# Patient Record
Sex: Female | Born: 1955 | Race: White | Marital: Married
Health system: Northeastern US, Academic
[De-identification: ages and names within clinical notes are randomized; demographics above are authoritative.]

## PROBLEM LIST (undated history)

## (undated) DIAGNOSIS — Z85820 Personal history of malignant melanoma of skin: Secondary | ICD-10-CM

## (undated) DIAGNOSIS — M8580 Other specified disorders of bone density and structure, unspecified site: Secondary | ICD-10-CM

## (undated) DIAGNOSIS — S5292XA Unspecified fracture of left forearm, initial encounter for closed fracture: Secondary | ICD-10-CM

## (undated) DIAGNOSIS — R51 Headache: Secondary | ICD-10-CM

## (undated) DIAGNOSIS — E559 Vitamin D deficiency, unspecified: Secondary | ICD-10-CM

## (undated) DIAGNOSIS — N841 Polyp of cervix uteri: Secondary | ICD-10-CM

## (undated) DIAGNOSIS — R519 Headache, unspecified: Secondary | ICD-10-CM

## (undated) DIAGNOSIS — I499 Cardiac arrhythmia, unspecified: Secondary | ICD-10-CM

## (undated) DIAGNOSIS — R002 Palpitations: Secondary | ICD-10-CM

## (undated) DIAGNOSIS — D649 Anemia, unspecified: Secondary | ICD-10-CM

## (undated) DIAGNOSIS — Z862 Personal history of diseases of the blood and blood-forming organs and certain disorders involving the immune mechanism: Secondary | ICD-10-CM

## (undated) DIAGNOSIS — Z9889 Other specified postprocedural states: Secondary | ICD-10-CM

## (undated) HISTORY — DX: Unspecified fracture of left forearm, initial encounter for closed fracture: S52.92XA

## (undated) HISTORY — DX: Vitamin D deficiency, unspecified: E55.9

## (undated) HISTORY — PX: WISDOM TOOTH EXTRACTION: SHX21

## (undated) HISTORY — DX: Anemia, unspecified: D64.9

## (undated) HISTORY — DX: Personal history of diseases of the blood and blood-forming organs and certain disorders involving the immune mechanism: Z86.2

## (undated) HISTORY — DX: Palpitations: R00.2

## (undated) HISTORY — DX: Other specified disorders of bone density and structure, unspecified site: M85.80

## (undated) HISTORY — DX: Other specified postprocedural states: Z98.890

## (undated) HISTORY — DX: Polyp of cervix uteri: N84.1

## (undated) HISTORY — PX: COLONOSCOPY: SHX174

## (undated) HISTORY — DX: Personal history of malignant melanoma of skin: Z85.820

---
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
## 1982-12-04 HISTORY — PX: BREAST LUMPECTOMY: SHX2

## 1998-12-04 DIAGNOSIS — Z9889 Other specified postprocedural states: Secondary | ICD-10-CM

## 1998-12-04 HISTORY — DX: Other specified postprocedural states: Z98.890

## 2004-02-08 ENCOUNTER — Other Ambulatory Visit: Admission: RE | Admit: 2004-02-08 | Discharge: 2004-02-08 | Payer: Self-pay | Admitting: *Deleted

## 2004-11-22 ENCOUNTER — Ambulatory Visit: Payer: Self-pay | Admitting: Internal Medicine

## 2005-04-18 ENCOUNTER — Other Ambulatory Visit: Admission: RE | Admit: 2005-04-18 | Discharge: 2005-04-18 | Payer: Self-pay | Admitting: *Deleted

## 2005-05-02 ENCOUNTER — Ambulatory Visit: Payer: Self-pay | Admitting: Internal Medicine

## 2005-12-04 DIAGNOSIS — N841 Polyp of cervix uteri: Secondary | ICD-10-CM

## 2005-12-04 HISTORY — DX: Polyp of cervix uteri: N84.1

## 2006-01-16 ENCOUNTER — Ambulatory Visit: Payer: Self-pay | Admitting: Internal Medicine

## 2006-01-19 ENCOUNTER — Ambulatory Visit: Payer: Self-pay | Admitting: Internal Medicine

## 2006-02-06 ENCOUNTER — Ambulatory Visit: Payer: Self-pay | Admitting: Internal Medicine

## 2006-03-14 ENCOUNTER — Encounter: Payer: Self-pay | Admitting: Internal Medicine

## 2006-06-27 ENCOUNTER — Other Ambulatory Visit: Admission: RE | Admit: 2006-06-27 | Discharge: 2006-06-27 | Payer: Self-pay | Admitting: Obstetrics & Gynecology

## 2007-02-20 ENCOUNTER — Ambulatory Visit: Payer: Self-pay | Admitting: Internal Medicine

## 2007-10-11 ENCOUNTER — Other Ambulatory Visit: Admission: RE | Admit: 2007-10-11 | Discharge: 2007-10-11 | Payer: Self-pay | Admitting: Obstetrics & Gynecology

## 2007-12-06 ENCOUNTER — Emergency Department (HOSPITAL_COMMUNITY): Admission: EM | Admit: 2007-12-06 | Discharge: 2007-12-06 | Payer: Self-pay | Admitting: Emergency Medicine

## 2008-03-18 ENCOUNTER — Encounter: Payer: Self-pay | Admitting: Internal Medicine

## 2008-07-10 ENCOUNTER — Ambulatory Visit: Payer: Self-pay | Admitting: Internal Medicine

## 2008-07-10 DIAGNOSIS — R5383 Other fatigue: Secondary | ICD-10-CM | POA: Insufficient documentation

## 2008-07-10 DIAGNOSIS — M2550 Pain in unspecified joint: Secondary | ICD-10-CM

## 2008-07-10 DIAGNOSIS — IMO0001 Reserved for inherently not codable concepts without codable children: Secondary | ICD-10-CM | POA: Insufficient documentation

## 2008-07-10 DIAGNOSIS — M2559 Pain in other specified joint: Secondary | ICD-10-CM | POA: Insufficient documentation

## 2008-07-10 DIAGNOSIS — R5381 Other malaise: Secondary | ICD-10-CM | POA: Insufficient documentation

## 2008-07-22 LAB — CONVERTED CEMR LAB
Albumin: 4.7 g/dL (ref 3.5–5.2)
CO2: 26 meq/L (ref 19–32)
Calcium: 9.9 mg/dL (ref 8.4–10.5)
Creatinine, Ser: 0.86 mg/dL (ref 0.40–1.20)
Eosinophils Absolute: 0.2 10*3/uL (ref 0.0–0.7)
Eosinophils Relative: 4 % (ref 0–5)
Free T4: 1.2 ng/dL (ref 0.89–1.80)
Glucose, Bld: 87 mg/dL (ref 70–99)
HCT: 39.8 % (ref 36.0–46.0)
Indirect Bilirubin: 0.8 mg/dL (ref 0.0–0.9)
Lymphs Abs: 1.4 10*3/uL (ref 0.7–4.0)
MCHC: 34.2 g/dL (ref 30.0–36.0)
MCV: 87.9 fL (ref 78.0–100.0)
Platelets: 245 10*3/uL (ref 150–400)
RDW: 13 % (ref 11.5–15.5)
Rhuematoid fact SerPl-aCnc: <20 intl units/mL (ref 0–20)
Sed Rate: 5 mm/hr (ref 0–22)
Total Protein: 7.2 g/dL (ref 6.0–8.3)
WBC: 4.7 10*3/uL (ref 4.0–10.5)

## 2008-08-17 ENCOUNTER — Ambulatory Visit: Payer: Self-pay | Admitting: Sports Medicine

## 2008-08-17 DIAGNOSIS — M25569 Pain in unspecified knee: Secondary | ICD-10-CM | POA: Insufficient documentation

## 2008-09-07 ENCOUNTER — Ambulatory Visit: Payer: Self-pay | Admitting: Sports Medicine

## 2008-09-24 ENCOUNTER — Telehealth: Payer: Self-pay | Admitting: Internal Medicine

## 2008-10-07 ENCOUNTER — Ambulatory Visit: Payer: Self-pay | Admitting: Sports Medicine

## 2008-10-07 ENCOUNTER — Encounter: Payer: Self-pay | Admitting: Internal Medicine

## 2008-10-09 ENCOUNTER — Encounter: Payer: Self-pay | Admitting: Sports Medicine

## 2008-11-09 ENCOUNTER — Ambulatory Visit: Payer: Self-pay | Admitting: Internal Medicine

## 2008-12-02 ENCOUNTER — Encounter: Payer: Self-pay | Admitting: Internal Medicine

## 2008-12-02 LAB — CONVERTED CEMR LAB
LDL Cholesterol: 110 mg/dL — ABNORMAL HIGH (ref 0–99)
Total CHOL/HDL Ratio: 2.7
VLDL: 10 mg/dL (ref 0–40)

## 2008-12-16 ENCOUNTER — Other Ambulatory Visit: Admission: RE | Admit: 2008-12-16 | Discharge: 2008-12-16 | Payer: Self-pay | Admitting: Obstetrics & Gynecology

## 2009-03-29 ENCOUNTER — Telehealth (INDEPENDENT_AMBULATORY_CARE_PROVIDER_SITE_OTHER): Payer: Self-pay | Admitting: *Deleted

## 2009-07-28 ENCOUNTER — Encounter: Payer: Self-pay | Admitting: Internal Medicine

## 2010-05-30 ENCOUNTER — Ambulatory Visit: Payer: Self-pay | Admitting: Internal Medicine

## 2010-05-30 LAB — CONVERTED CEMR LAB
BUN: 18 mg/dL (ref 6–23)
Basophils Relative: 0.8 % (ref 0.0–3.0)
Bilirubin Urine: NEGATIVE
Bilirubin, Direct: 0.2 mg/dL (ref 0.0–0.3)
CO2: 30 meq/L (ref 19–32)
Chloride: 107 meq/L (ref 96–112)
Cholesterol: 201 mg/dL — ABNORMAL HIGH (ref 0–200)
Creatinine, Ser: 0.7 mg/dL (ref 0.4–1.2)
Direct LDL: 104 mg/dL
Eosinophils Absolute: 0.2 10*3/uL (ref 0.0–0.7)
Glucose, Urine, Semiquant: NEGATIVE
Lymphs Abs: 1.4 10*3/uL (ref 0.7–4.0)
MCHC: 34.9 g/dL (ref 30.0–36.0)
MCV: 90.9 fL (ref 78.0–100.0)
Monocytes Absolute: 0.3 10*3/uL (ref 0.1–1.0)
Neutrophils Relative %: 55.2 % (ref 43.0–77.0)
Platelets: 199 10*3/uL (ref 150.0–400.0)
RBC: 4.56 M/uL (ref 3.87–5.11)
TSH: 1.7 microintl units/mL (ref 0.35–5.50)
Total Protein: 7.2 g/dL (ref 6.0–8.3)
pH: 5

## 2010-06-08 ENCOUNTER — Ambulatory Visit: Payer: Self-pay | Admitting: Internal Medicine

## 2010-06-08 DIAGNOSIS — L84 Corns and callosities: Secondary | ICD-10-CM | POA: Insufficient documentation

## 2010-06-08 DIAGNOSIS — R82998 Other abnormal findings in urine: Secondary | ICD-10-CM | POA: Insufficient documentation

## 2010-06-08 LAB — CONVERTED CEMR LAB
Glucose, Urine, Semiquant: NEGATIVE
Specific Gravity, Urine: >=1.03
WBC Urine, dipstick: NEGATIVE
pH: 5

## 2010-06-17 ENCOUNTER — Ambulatory Visit: Payer: Self-pay | Admitting: Internal Medicine

## 2010-06-20 ENCOUNTER — Encounter: Payer: Self-pay | Admitting: *Deleted

## 2010-06-20 LAB — CONVERTED CEMR LAB
Bilirubin Urine: NEGATIVE
Ketones, ur: NEGATIVE mg/dL
Leukocytes, UA: NEGATIVE
Urine Glucose: NEGATIVE mg/dL
Urobilinogen, UA: 0.2 (ref 0.0–1.0)

## 2010-12-20 ENCOUNTER — Encounter: Payer: Self-pay | Admitting: Internal Medicine

## 2011-01-05 NOTE — Letter (Signed)
Summary: Generic Letter  [place] [facility]  [address] [place], [place] [address]   Phone: [phone]  Fax: [phone]    06/20/2010  Natalie [name] [address] [place], [place]  [address]  Dear Ms. DONNAN,  Your urine is now normal. If you have any questions, please call us back at [phone].         Sincerely,   Tor Netters, CMA (AAMA)

## 2011-01-05 NOTE — Assessment & Plan Note (Signed)
Summary: CPX/CJR   Vital Signs:  Patient profile:   55 year old female Menstrual status:  postmenopausal Height:      64 inches Weight:      174 pounds BMI:     29.97 Pulse rate:   68 / minute Pulse rhythm:   regular BP sitting:   104 / 74  (left arm) Cuff size:   regular  Vitals Entered By: Raechel Ache, RN (June 08, 2010 3:35 PM) CC: CPX, labs done. ?wart R foot and R 3rd finger goes numb.     Menstrual Status postmenopausal   History of Present Illness: Natalie [name] comes in today  for preventive visit  and a copule of issues . Since last visit  here  there have been no major changes in health status   Has wart or callus righ t foot for a wheil and has tried treating this  but persists .  See hpi issues witwh hand   Due for colonoscopy . UTD mammo .  Pap per Dr Hyacinth Meeker    Preventive Screening-Counseling & Management  Alcohol-Tobacco     Alcohol drinks/day: <1     Smoking Status: never  Caffeine-Diet-Exercise     Caffeine use/day: 1 qd     Does Patient Exercise: yes     Type of exercise: walking/elliptical     Times/week: 7  Hep-HIV-STD-Contraception     Dental Visit-last 6 months yes     Sun Exposure-Excessive: no  Safety-Violence-Falls     Seat Belt Use: yes     Firearms in the Home: no firearms in the home     Smoke Detectors: yes  Allergies: No Known Drug Allergies  Past History:  Past Medical History: Palpitations  Melanoma  2003 dexa 2010 -1.9 Knee pain  Past History:  Care Management: derm [facility] Hyacinth Meeker   Family History: Family History of Alcoholism/Addiction Family History of Arthritis - Osteoarthritis Family History Hypertension Family History Lung cancer Family History Other cancer-Breast,Prostate Family History of Cardiovascular disorder Fam hx Stroke    mom  viral meningitis and HH.     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         right foot  with  wart   tried to treat  continues   right middle finger stiff and numb at times    affects adls mildly no injury  Physical Exam General Appearance: well developed, well nourished, no acute distress Eyes: conjunctiva and lids normal, PERRLA, EOMI, fundi WNL Ears, Nose, Mouth, Throat: TM clear, nares clear, oral exam WNL Neck: supple, no lymphadenopathy, no thyromegaly, no JVD Respiratory: clear to auscultation and percussion, respiratory effort normal Cardiovascular: regular rate and rhythm, S1-S2, no murmur, rub or gallop, no bruits, peripheral pulses normal and symmetric, no cyanosis, clubbing, edema or varicosities Chest: no scars, masses, tenderness; no asymmetry, skin changes, nipple discharge   Gastrointestinal: soft, non-tender; no  hepatosplenomegaly, masses; active bowel sounds all quadrants,  Lymphatic: no cervical, axillary or inguinal adenopathy Musculoskeletal: gait normal, muscle tone and strength WNL, no joint swelling, effusions, discoloration, crepitus right hadn with slight enlargement middle finger  and grip ok ? popping on flexion   nl by inspection,  small cyst ? nodule  palmar surface mobile  nontender Skin: clear, good turgor, color WNL, no rashes, lesions, or ulcerations  small callus areas side of r foot and middle of sole look like corn  Neurologic: normal mental  status, normal reflexes, normal strength, sensation, and motion Psychiatric: alert; oriented to person, place and time Other Exam:  see labs  reviewed     Impression & Recommendations:  Problem # 1:  HEALTH MAINTENANCE EXAM, ADULT (ICD-V70.0) Discussed nutrition,exercise,diet,healthy weight, vitamin D and calcium.   utd on prev except colonoscopy.  Problem # 2:  PAIN IN JOINT OTHER SPECIFIED SITES (ICD-719.48) right finger  middle . unsure  if more numbness or stiffness or popping.   NOt severe  .  if persistent or  progressive  consider seeing hand spec.   Problem # 3:  abnormal ua  prob insig .Marland Kitchenrepeat   Problem # 4:  CORNS AND CALLUSES (ICD-700) looks like a corn more than a wart and location  is at pressure point .   contniue otc check shoes etc.  can see podiatrist if persistent or  progressive    Patient Instructions: 1)  call if you with need for   hand appt   .especially if signs are persistent or  progressive  .  Dr  Onalee Hua .  rec  2)   topical otc for the corn areas  with care  . 3)  Colonscopy  usually   is every 10 years unless there is a problem. 4)  Let us know    about the colonoscopy date results and then  when repeat recommended.  5)  Will refer if due.   6)  Continue healthy eating and   exercise.    7)  Repeat UA  with  microscopic  when hydrated July 15th    8)  .            Preventive Care Screening  Bone Density:    Date:  07/04/2009    Results:  normal std dev  Mammogram:    Date:  07/04/2009    Results:  normal    Laboratory Results   Urine Tests    Routine Urinalysis   Color: yellow Appearance: Clear Glucose: negative   (Normal Range: Negative) Bilirubin: negative   (Normal Range: Negative) Ketone: negative   (Normal Range: Negative) Spec. Gravity: >=1.030   (Normal Range: 1.003-1.035) Blood: small   (Normal Range: Negative) pH: 5.0   (Normal Range: 5.0-8.0) Protein: 30   (Normal Range: Negative) Urobilinogen: 0.2   (Normal  Range: 0-1) Nitrite: negative   (Normal Range: Negative) Leukocyte Esterace: negative   (Normal Range: Negative)        Preventive Care Screening  Bone Density:    Date:  07/04/2009    Results:  normal std dev  Mammogram:    Date:  07/04/2009    Results:  normal

## 2011-04-21 NOTE — Assessment & Plan Note (Signed)
[facility]                                 ON-CALL NOTE   NAME:[name], Natalie                         MRN:          [id number]  DATE:05/04/2007                            DOB:          04/13/1956    TIME OF CALL:  9:12 p.m.   CALLER:  Alois Cliche.   PHYSICIAN:  Dr. Gavin Potters.   TELEPHONE:  [phone].   The patient complains of a dog bite.  She says she just broke up a fight  between two neighborhood dogs.  She says she is very familiar with both  of the dogs and their behavior has otherwise been normal.  Her neighbors  say that the dogs shots are all up to date.  The patient herself had a  tetanus booster in 2007.  She says the 3 wounds are fairly superficial.  She has cleaned them off with water and hydrogen peroxide.  She wants to  know if anything else needs to be done.  I advised her to go ahead and  dress the wounds with Neosporin and either gauze or Band-Aid's.  It  sounds like they should do fairly well.  However, if she develops any  increased redness, swelling, pain or fever she should let me know.     Tera Mater. Clent Ridges, MD  Electronically Signed    SAF/MedQ  DD: 05/05/2007  DT: 05/05/2007  Job #: [id number]

## 2011-08-24 LAB — I-STAT 8, (EC8 V) (CONVERTED LAB)
Acid-Base Excess: 1
Bicarbonate: 27.4 — ABNORMAL HIGH
Glucose, Bld: 87
Potassium: 3.7
Sodium: 141
TCO2: 29
pH, Ven: 7.358 — ABNORMAL HIGH

## 2011-08-24 LAB — POCT I-STAT CREATININE
Creatinine, Ser: 0.9
Operator id: 133351

## 2011-08-24 LAB — DIFFERENTIAL
Eosinophils Absolute: 0.1
Eosinophils Relative: 4
Lymphocytes Relative: 32
Lymphs Abs: 1.3
Monocytes Relative: 9

## 2011-08-24 LAB — CBC
HCT: 38.6
MCV: 87.4
RBC: 4.42
WBC: 4

## 2011-08-24 LAB — POCT CARDIAC MARKERS
Myoglobin, poc: 63.5
Operator id: 133351
Troponin i, poc: <0.05

## 2011-11-09 ENCOUNTER — Ambulatory Visit (INDEPENDENT_AMBULATORY_CARE_PROVIDER_SITE_OTHER): Payer: 59 | Admitting: Sports Medicine

## 2011-11-09 VITALS — BP 114/64 | Ht 64.0 in | Wt 168.0 lb

## 2011-11-09 DIAGNOSIS — R209 Unspecified disturbances of skin sensation: Secondary | ICD-10-CM

## 2011-11-09 DIAGNOSIS — R200 Anesthesia of skin: Secondary | ICD-10-CM | POA: Insufficient documentation

## 2011-11-09 DIAGNOSIS — M2559 Pain in other specified joint: Secondary | ICD-10-CM

## 2011-11-09 NOTE — Assessment & Plan Note (Signed)
Left shoulder pain is nonspecific  Seems like she may have had a remote labral injury in HS sports  Not unstable and strong rotator cuff  Given some exercises to keep RC strong Recheck prn for problems  Scan if pain worsens

## 2011-11-09 NOTE — Assessment & Plan Note (Signed)
Test for any significant neural compression were neg  Trial on night splint  If sxs persist [place] the RT hand to check for carpal tunnerl  Padded gloves for biking

## 2011-11-09 NOTE — Patient Instructions (Addendum)
Do no stretch your lt arm/shoulder behind you  Lie on back concentrating on flattening your shoulders to floor with your arms stretches out to the sides and over head.  Hold in each position 5 times for 30 seconds.   Rowing motion at waist level- focusing on pushing your shoulders together.  Work hand in warm water to help with motion   Do suggested exercises for rotator cuff daily  Follow up as needed.  Thank you for seeing [place] today!

## 2011-11-09 NOTE — Progress Notes (Signed)
  Subjective:    Patient ID: Natalie [name], female    DOB: 1956-04-24, 55 y.o.   MRN: [id number]  HPI  Numbness of 3rd-5th fingers on rt hand, worse at night. Has locking of 3rd finger on rt hand, that she has to open manually.  In the mornings she feels tightness in hands.  Has a small ganglion cyst 3rd finger rt hand that has been there for years.  Sleeps with rt arm over a pillow.   Also has lt shoulder/arm pain- that causes her to be unable to use the lt arm.  Describes a popping sensation, then arm is immobile.  Recently has also felt grinding in lt shoulder joint.  Played softball growing up, recalls shoulder injury where she fell with arm extended to the side.   No thyroid disease/ DM or other chronic illness  Review of Systems     Objective:   Physical Exam  Small cyst on mid phalanx of rt 3rd finger  Flexor tendon nodule in palm of 3rd metacarpal Rt wrist - full ulnar and radial deviation, full flexion and extension Augmented phalen test- slight tingling of rt 3rd finger Phlen's negative- bilat Guyon's cannal - neg Cubital tunnel - neg     Full ROM of rt shoulder Good IR and ER Full abduction and elevation strength bilat Empty can - neg bilat Hawkins neg on left Neer's test- causes tightness at shoulder Cross over test negative on lt  O'Briens test neg on lt Clunk test- tightness with ER and slight click but no clunk on lt Jobe's relocation test- positive but mild        Assessment & Plan:

## 2012-07-29 DIAGNOSIS — I8390 Asymptomatic varicose veins of unspecified lower extremity: Secondary | ICD-10-CM | POA: Insufficient documentation

## 2012-09-04 ENCOUNTER — Encounter: Payer: Self-pay | Admitting: Internal Medicine

## 2012-10-06 ENCOUNTER — Telehealth: Payer: Self-pay | Admitting: Gastroenterology

## 2012-10-06 NOTE — Telephone Encounter (Signed)
On call note @ 1400. Pt is scheduled for colonoscopy tomorrow with Dr. Loreta Ave. She had nuts and one Aleve a couple days ago and a solid breakfast with fruit today. Wants to know if she should go on with bowel prep and colonoscopy. Advised to follow the clear liquid diet very closely for the rest of the day and to vigorously push clear liquids the rest of the day. Advised to complete MoviPrep as ordered by Dr. Loreta Ave and proceed with colonoscopy tomorrow. Pt reassured that this should give very good prep results.

## 2013-02-28 ENCOUNTER — Ambulatory Visit (INDEPENDENT_AMBULATORY_CARE_PROVIDER_SITE_OTHER): Payer: Self-pay | Admitting: Internal Medicine

## 2013-02-28 ENCOUNTER — Encounter: Payer: Self-pay | Admitting: Internal Medicine

## 2013-02-28 VITALS — BP 116/76 | HR 66 | Temp 98.3°F | Wt 173.0 lb

## 2013-02-28 DIAGNOSIS — L84 Corns and callosities: Secondary | ICD-10-CM

## 2013-02-28 DIAGNOSIS — Z85820 Personal history of malignant melanoma of skin: Secondary | ICD-10-CM | POA: Insufficient documentation

## 2013-02-28 DIAGNOSIS — M79609 Pain in unspecified limb: Secondary | ICD-10-CM

## 2013-02-28 DIAGNOSIS — M79673 Pain in unspecified foot: Secondary | ICD-10-CM | POA: Insufficient documentation

## 2013-02-28 DIAGNOSIS — M79671 Pain in right foot: Secondary | ICD-10-CM

## 2013-02-28 HISTORY — DX: Personal history of malignant melanoma of skin: Z85.820

## 2013-02-28 NOTE — Patient Instructions (Signed)
I AGREE  Unusual  Location for a callous .Drenda Freeze.    But dont seen typical warty tissue . Would avoid freezing if not  Really a wart for   the reasons  Discussed .    Consider seeing  Someone for  Foot mechanic  Manipulation.  Natalie [name]?   Get x ray    To R/o FB

## 2013-02-28 NOTE — Progress Notes (Signed)
Chief Complaint  Patient presents with  . Mass    On the bottom of her rt foot.  First noticed a few weeks ago and the pain is getting worse.    HPI: Patient comes in today for SDA for  new problem evaluation. Last ov 7 11 . She generally sees her gynecologist for routine care prevention. It is generally well.  However has had a problem over the last couple weeks of a nodular area painful on her right foot below the metatarsal area laterally. Pumice board  wonders if it's a wart hx of corns and calluses  At 5th met base .   In past  Has seen  Derm and they dont think she has warts.    No rx cant see area to do this.  Remote hs of sm eval for knee.  No hx of stepping on fb/   ROS: See pertinent positives and negatives per HPI.  Past Medical History  Diagnosis Date  . Hx of malignant melanoma 02/28/2013    2003 followed by derm.    . Palpitations   . Hx of breast biopsy 2000    Family History  Problem Relation Age of Onset  . Hypertension    . Lung cancer    . Stroke    . Other      mom viral meningitis  . Osteoarthritis    . Alcoholism    . Breast cancer     Not abstracted yet reviewed summary ` in prev  health record  Updated summmary History   Social History  . Marital Status: Single    Spouse Name: N/A    Number of Children: N/A  . Years of Education: N/A   Social History Main Topics  . Smoking status: Never Smoker   . Smokeless tobacco: None  . Alcohol Use: None  . Drug Use: None  . Sexually Active: None   Other Topics Concern  . None   Social History Narrative   hh of 2    Neg td    PHD sports and exercise science    Works as Physical therapist    Has been PE teacher     Outpatient Encounter Prescriptions as of 02/28/2013  Medication Sig Dispense Refill  . VITAMIN D, CHOLECALCIFEROL, PO Take by mouth.         No facility-administered encounter medications on file as of 02/28/2013.    EXAM:  BP 116/76  Pulse 66  Temp(Src) 98.3 F (36.8 C)  (Oral)  Wt 173 lb (78.472 kg)  BMI 29.68 kg/m2  SpO2 98%  Body mass index is 29.68 kg/(m^2).  GENERAL: vitals reviewed and listed above, alert, oriented, appears well hydrated and in no acute distress   MS: moves all extremities without noticeable focal  Abnormality right foot grossly normal however on sole of foot she does have a mild callus at the first MTP and the fifth. Head  There is another area feeling nodular about 1 cm near the fifth metatarsal lateral  on inspection with magnification it appears to be a corn.   Didn't hit area with no typical capillaries or foreign body or some mild tenderness but no redness.  Neurovascular is grossly intact of her foot anyway  PSYCH: pleasant and cooperative, no obvious depression or anxiety  ASSESSMENT AND PLAN:  Discussed the following assessment and plan:  Foot pain, right - Plan: DG Foot Complete Right  CORNS AND CALLUSES - Plan: DG Foot Complete Right  Hx of  malignant melanoma Discussed situation really does not look like a wart can be frozen but there is no guarantee Wart  would be helped by this and freezing works by tissue destruction anyway. Discussed other options 1 to avoid cutting and scarring on the bottom of her foot but I agree its and a unusual area for pressure point she has adapted her shoe to relieve the pressure with some help. She apparently has had the dermatologist look at the bottom of her feet in the past and reported that none of her areas were wart infections.  Can use topicals soak and  Pare if needed. Relieve pressure   She can ask Dr fields specifically    If there are other ideas. If anything invasive today  I would get an x-ray to rule out radiopaque foreign body. Order in system can decide if wants to proceed or wait .and use conservative rx.  Informed her that I could freeze the area but it for my foot I wouldn't do it.  He will use conservative measures -Patient advised to return or notify health care team  if  symptoms worsen or persist or new concerns arise.  Dis preventive care and need s  PV when  Needed   Patient Instructions  I AGREE  Unusual  Location for a callous .Drenda Freeze.    But dont seen typical warty tissue . Would avoid freezing if not  Really a wart for   the reasons  Discussed .    Consider seeing  Someone for  Foot mechanic  Manipulation.  [name]?   Get x ray    To R/o FB        [name] M.D.

## 2013-04-09 ENCOUNTER — Other Ambulatory Visit: Payer: Self-pay | Admitting: *Deleted

## 2013-04-09 MED ORDER — ERGOCALCIFEROL 1.25 MG (50000 UT) PO CAPS: 50000.0000 [IU] | ORAL_CAPSULE | ORAL | Status: DC

## 2013-04-09 NOTE — Telephone Encounter (Signed)
Faxed refill request received from pharmacy for VITAMIN D [id number] Last filled by MD on 08/19/12, #26 X 0 Last AEX - 08/19/12 Last Vitamin D level = 37 on 08/19/12 Next AEX - 09/25/13 Please advise refill.  Chart in your door.  Thanks.

## 2013-04-21 ENCOUNTER — Encounter: Payer: Self-pay | Admitting: Internal Medicine

## 2013-04-22 ENCOUNTER — Telehealth: Payer: Self-pay | Admitting: Internal Medicine

## 2013-04-22 NOTE — Telephone Encounter (Signed)
Pt did not get the foot films ordered 02/28/2013. Please advise.

## 2013-04-23 NOTE — Telephone Encounter (Signed)
If patient is doing better than can cancel foot x ray.

## 2013-04-24 NOTE — Telephone Encounter (Signed)
I cannot cancel the order. Only a clinical person can. Please cancel order per Dr Durene Fruits instruction if pt better.

## 2013-04-25 NOTE — Telephone Encounter (Signed)
Order cancelled

## 2013-04-30 ENCOUNTER — Ambulatory Visit (INDEPENDENT_AMBULATORY_CARE_PROVIDER_SITE_OTHER): Payer: BC Managed Care – PPO | Admitting: Sports Medicine

## 2013-04-30 ENCOUNTER — Encounter: Payer: Self-pay | Admitting: Sports Medicine

## 2013-04-30 VITALS — BP 116/81 | Ht 63.0 in | Wt 168.0 lb

## 2013-04-30 DIAGNOSIS — M7061 Trochanteric bursitis, right hip: Secondary | ICD-10-CM | POA: Insufficient documentation

## 2013-04-30 DIAGNOSIS — B070 Plantar wart: Secondary | ICD-10-CM

## 2013-04-30 DIAGNOSIS — M76899 Other specified enthesopathies of unspecified lower limb, excluding foot: Secondary | ICD-10-CM

## 2013-04-30 DIAGNOSIS — M79673 Pain in unspecified foot: Secondary | ICD-10-CM

## 2013-04-30 DIAGNOSIS — R209 Unspecified disturbances of skin sensation: Secondary | ICD-10-CM

## 2013-04-30 DIAGNOSIS — M79609 Pain in unspecified limb: Secondary | ICD-10-CM

## 2013-04-30 DIAGNOSIS — R200 Anesthesia of skin: Secondary | ICD-10-CM

## 2013-04-30 NOTE — Assessment & Plan Note (Signed)
I think this is still likely positional in nature for her tingling sensation in the mornings. I think she sleeps with her right arm above her head causing some mild compression the potential brachial plexi occurring. Discuss sleeping position, given home exercise program. If patient continues to have a problem we will consider getting x-ray of the cervical neck to evaluate for osteoarthritic changes.

## 2013-04-30 NOTE — Progress Notes (Signed)
Patient is a 57 year old female coming in with multiple medical problems today.  1. patient is here for referral from Dr. Fabian Sharp for right foot pain.  Patient states that she's had this pain for greater than 2 months. Patient does not remember any true injury. Patient states that it has gradually gotten worse. Patient states that it hurts more on the plantar aspect of the foot on the lateral aspect of the right foot. Patient denies any radiation. Patient states that there is a bump or mass in the area. Patient has been told that if the callus versus corn a different individuals over the course of the time. Patient has tried changing her orthotics as well as cutting her orthotics and there is no stress whatsoever on the area. Patient states that this has been somewhat helpful but still over the course of a day he has significant amount pain. Patient has not tried anything for her pain.  2. patient also still complains of some numbness and tingling into the right hand. Patient states that this usually unfortunately only happens when she wakes up in the morning and and seems to go away. Patient does not do this tingling sensation during the day. Patient is able to do all her activities of daily living. Patient denies any true injury. Patient has never had any x-rays. Patient denies any weakness in the upper extremity itself. Also not taking any specific medications for this problem.  3. right hip pain: Patient has had right hip pain for quite some time as well. Patient does sleep on her right side and notices tenderness when she wakes up. Patient states it hurts to palpation. Patient denies any radiation down the leg denies any back pain denies any swelling or numbness of the lower extremity. Patient's has done some stretching that seems to be beneficial. Patient states it does improve during the course of the day and then seems to worsen again when she wakes up in the morning.  Past medical history, social,  surgical and family history all reviewed.   Physical exam Blood pressure 116/81, height 5\' 3"  (1.6 m), weight 168 lb (76.204 kg). General: No apparent distress alert and oriented x3 mood and affect normal Respiratory: Patient's speak in full sentences and does not appear short of breath Skin: Warm dry intact with no signs of infection or rash Neuro: Cranial nerves II through XII are intact, neurovascularly intact in all extremities with 2+ DTRs and 2+ pulses.  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 Patient does have significant callus formation occurring on the heels on the lateral aspect bilaterally. On patient's plantar foot she does have a plantar warts that is sitting over the base of the fifth metatarsal. There is no signs of infection no signs of foreign body. Looking at patient's foot architecture she also does have breakdown of the transverse arch causing splaying between the third and fourth toes bilaterally right greater than left. Patient is also having mild breakdown of the longitudinal lateral as well as medial arches. Right hip exam: On inspection patient does have full internal range of motion without any pain. She  is minimally tender to palpation over the greater trochanteric area. Patient does have a positive Faber test.

## 2013-04-30 NOTE — Patient Instructions (Signed)
Good to see you I am giving you exercises for your hip and neck.  For your hand try to get a squeeze ball and see if this helps. For your foot I think this is a plantar war.  I debrided it today. Get over the counter wart remover, put on nightly and wear a piece of duct tape over it.  I put new insoles in your shoes.  Wear them 3 hours and then increase an hour daily.  If you want we can make custom orthotics.  Come back again in 4 weeks if you are having pain or if you want to adjust the orthotics.

## 2013-04-30 NOTE — Assessment & Plan Note (Signed)
Patient does have breakdown of the transverse arch as well as somewhat of the longitudinal lateral arch being the most complicated. Patient did have sports insoles with corrections as stated above. Patient will try this and may need custom orthotics in the future. Patient will call in 2 weeks to make an appointment if this is what she would like. We would like patient to have the resolution of the plantar wart first and then discuss proper fitting for custom orthotics.

## 2013-04-30 NOTE — Assessment & Plan Note (Signed)
When discussing different exposures patient did go to a community portal into the shower at the [facility] approximately 2-1/2 months ago. I think this is likely where she contracted toward. This was debrided today. Patient will also do acetoacetic  Acid and duct tape in the area. Patient was given sports insoles today he with corrections of the metatarsal pads as well as a lateral heel wedge bilaterally to help keep foot in a more neutral position. Patient also did have EVA form added to the bottom of the insole to relieve the pressure on the plantar wart. This will try these interventions and then come back again in 4 weeks for further evaluation.

## 2013-04-30 NOTE — Assessment & Plan Note (Signed)
Mild in nature at this time. Patient given home exercise program and can do over-the-counter anti-inflammatories as needed. Discussed icing protocol as well as sleeping position that could be helpful. Patient may sleep with a pillow between her legs to keep her in a more neutral position. Patient will try these interventions and if still having pain in 4 weeks we can consider corticosteroid injection.

## 2013-06-04 ENCOUNTER — Ambulatory Visit: Payer: BC Managed Care – PPO | Admitting: Sports Medicine

## 2013-06-25 ENCOUNTER — Ambulatory Visit (INDEPENDENT_AMBULATORY_CARE_PROVIDER_SITE_OTHER): Payer: BC Managed Care – PPO | Admitting: Sports Medicine

## 2013-06-25 ENCOUNTER — Encounter: Payer: Self-pay | Admitting: Sports Medicine

## 2013-06-25 VITALS — BP 103/71 | HR 61 | Ht 63.0 in | Wt 168.0 lb

## 2013-06-25 DIAGNOSIS — B070 Plantar wart: Secondary | ICD-10-CM

## 2013-06-25 DIAGNOSIS — M25579 Pain in unspecified ankle and joints of unspecified foot: Secondary | ICD-10-CM

## 2013-06-25 DIAGNOSIS — M6530 Trigger finger, unspecified finger: Secondary | ICD-10-CM

## 2013-06-25 NOTE — Patient Instructions (Signed)
Thanks for coming in today Try the different arches that we made for you today Recommend that for your trigger finger you do massage, stretching, and also grip exercises in warm water.  You can also try rubbing aspercreme in the area Try to find shoes with less forefoot curve.

## 2013-06-25 NOTE — Progress Notes (Signed)
CC: Followup foot pain, new complaint of right middle trigger finger HPI: Patient is a 57 year old female who presents today for followup of bilateral foot pain. At her last visit we saw her for several complaints including bilateral foot pain, plantar wart, numbness and tingling of right hand, and right hip pain. Patient states that the numbness and tingling of her right hand as well as her hip pain has improved and has largely resolved. Her foot pain is also significantly improved since wearing the support insoles. She denies any pain or pressure and is wearing her insoles all the time. She is wondering a she needs to continue treatment for the plantar wart or if it has resolved. Patient is wondering if we can alter some of her other insoles and also that we can give her some advice on appropriate shoes for her.  Patient's main complaint today is that she has had some triggering of her right middle finger. 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 She says that when she is gripping onto something such as a physical therapy belt sometimes her right middle finger will get stuck. It is not painful for her but it does result in poor function.  ROS: As above in the HPI. All other systems are stable or negative.  OBJECTIVE: APPEARANCE:  Patient in no acute distress.The patient appeared well nourished and normally developed. HEENT: No scleral icterus. Conjunctiva non-injected Resp: Non labored Skin: No rash MSK:  Right foot: There is resolving right plantar wart over the area of the base of the fifth metatarsal. No tenderness in this area. Central core is still present. Right hand: There are 2 small palpable firm nodules on the right third finger between the PIP and DIP joints. These do not seem to catch with flexion of the finger. There is slight thickening of the extensor tendon as it crosses the MCP joint. No palpable nodules.  Unable to recreate triggering today.  MSK [place]: Transverse and longitudinal views of the volar aspect of the right third finger were obtained in a limited study. This showed 2 small hypoechoic fluid collections superficial to the flexor tendon between the DIP and PIP joint consistent with ganglion cyst. Imaging of the flexor tendon overlying the MCP joint shows an approximately 3 mm nodular thickening of the tendon sheath. Findings consistent with trigger finger.  ASSESSMENT: #1. Bilateral foot pain, improved with sport insoles #2. Right plantar wart, resolving with treatment #3. Right third finger trigger finger, mild  PLAN: #1. Patient  had significant improvement in her bilateral foot pain with arch support. She brought several other insoles today which we modified with metatarsal pad and lateral wedge.  #2. Plantar wart, improving. Patient was encouraged to continue treating this until there is full resolution of the hyperkeratotic area and core. #3. Right third finger trigger finger. Mild in nature at this point. Recommended massage, stretching, and grip exercises in warm water. Followup as needed.

## 2013-08-26 ENCOUNTER — Encounter: Payer: Self-pay | Admitting: Obstetrics & Gynecology

## 2013-09-25 ENCOUNTER — Ambulatory Visit: Payer: Self-pay | Admitting: Obstetrics & Gynecology

## 2013-09-30 ENCOUNTER — Ambulatory Visit: Payer: Self-pay | Admitting: Obstetrics & Gynecology

## 2013-10-27 ENCOUNTER — Encounter: Payer: Self-pay | Admitting: Obstetrics & Gynecology

## 2013-10-27 ENCOUNTER — Ambulatory Visit (INDEPENDENT_AMBULATORY_CARE_PROVIDER_SITE_OTHER): Payer: BC Managed Care – PPO | Admitting: Obstetrics & Gynecology

## 2013-10-27 VITALS — BP 120/72 | HR 58 | Resp 16 | Ht 62.5 in | Wt 173.4 lb

## 2013-10-27 DIAGNOSIS — Z01419 Encounter for gynecological examination (general) (routine) without abnormal findings: Secondary | ICD-10-CM

## 2013-10-27 DIAGNOSIS — Z0000 Encounter for general adult medical examination without abnormal findings: Secondary | ICD-10-CM

## 2013-10-27 LAB — POCT URINALYSIS DIPSTICK
Bilirubin, UA: NEGATIVE
Ketones, UA: NEGATIVE

## 2013-10-27 LAB — HEMOGLOBIN, FINGERSTICK: Hemoglobin, fingerstick: 14.1 g/dL (ref 12.0–16.0)

## 2013-10-27 MED ORDER — ERGOCALCIFEROL 1.25 MG (50000 UT) PO CAPS: 50000.0000 [IU] | ORAL_CAPSULE | ORAL | Status: DC

## 2013-10-27 MED ORDER — FLUCONAZOLE 150 MG PO TABS: ORAL_TABLET | ORAL | Status: DC

## 2013-10-27 NOTE — Progress Notes (Signed)
57 y.o. G0P0000 SingleCaucasianF here for annual exam.  Significant other's father passed in May.  She was in [place] for several weeks.  Pt went to [place] this fall.  Enjoyed going on this trip.  No vaginal bleeding.  D/W pt Pap guidelines.  Saw vein specialist in [facility].  Had some sclerotherapy this year.  Continuing with that but taking a break.    Patient's last menstrual period was 03/05/2007.          Sexually active: no  The current method of family planning is none.    Exercising: yes  walking, eliptical, hiking, and some biking Smoker:  no  Health Maintenance: Pap:  08/19/12 WNL/negative HR HPV History of abnormal Pap:  no MMG:  08/30/12 3D normal, overdue Colonoscopy:  2/14 repeat in 10 years, Dr. Loreta Ave BMD:   2010 osteopenia-1.5/-1.8 TDaP:  2006, pt reported Screening Labs: 2013, Hb today: 14.1, Urine today: WBC-2+, RBC-trace   reports that she has never smoked. She has never used smokeless tobacco. She reports that she drinks alcohol. She reports that she does not use illicit drugs.  Past Medical History  Diagnosis Date  . Hx of malignant melanoma 02/28/2013    2003 followed by derm.    . Palpitations   . Hx of breast biopsy 2000  . Anemia   . Cervical polyp 2007  . Vitamin D deficiency   . Osteopenia     mild    Past Surgical History  Procedure Laterality Date  . Tonsillectomy  1960  . Breast lumpectomy  1984    left breast    Current Outpatient Prescriptions  Medication Sig Dispense Refill  . B Complex Vitamins (VITAMIN B COMPLEX PO) Take by mouth daily.      . ergocalciferol (VITAMIN D2) 50000 UNITS capsule Take 1 capsule (50,000 Units total) by mouth once a week.  26 capsule  0  . Glucosamine HCl (GLUCOSAMINE PO) Take by mouth daily.      . Multiple Vitamins-Minerals (MULTIVITAMIN PO) Take by mouth daily.      . Calcium-Magnesium-Vitamin D (CALCIUM 500 PO) Take by mouth 2 (two) times daily.       No current facility-administered medications for this  visit.    Family History  Problem Relation Age of Onset  . Hypertension Paternal Uncle   . Lung cancer    . Stroke    . Other      mom viral meningitis  . Osteoarthritis    . Alcoholism    . Breast cancer Paternal Aunt   . Breast cancer Other     paternal great aunt    ROS:  Pertinent items are noted in HPI.  Otherwise, a comprehensive ROS was negative.  Exam:   BP 120/72  Pulse 58  Resp 16  Ht 5' 2.5" (1.588 m)  Wt 173 lb 6.4 oz (78.654 kg)  BMI 31.19 kg/m2  LMP 03/05/2007  Weight change: +5lbs   Height: 5' 2.5" (158.8 cm)  Ht Readings from Last 3 Encounters:  10/27/13 5' 2.5" (1.588 m)  06/25/13 5\' 3"  (1.6 m)  04/30/13 5\' 3"  (1.6 m)    General appearance: alert, cooperative and appears stated age Head: Normocephalic, without obvious abnormality, atraumatic Neck: no adenopathy, supple, symmetrical, trachea midline and thyroid normal to inspection and palpation Lungs: clear to auscultation bilaterally Breasts: normal appearance, no masses or tenderness Heart: regular rate and rhythm Abdomen: soft, non-tender; bowel sounds normal; no masses,  no organomegaly Extremities: extremities normal, atraumatic, no  cyanosis or edema Skin: Skin color, texture, turgor normal. No rashes or lesions Lymph nodes: Cervical, supraclavicular, and axillary nodes normal. No abnormal inguinal nodes palpated Neurologic: Grossly normal   Pelvic: External genitalia:  no lesions              Urethra:  normal appearing urethra with no masses, tenderness or lesions              Bartholins and Skenes: normal                 Vagina: normal appearing vagina with normal color and discharge, no lesions              Cervix: no lesions              Pap taken: no Bimanual Exam:  Uterus:  normal size, contour, position, consistency, mobility, non-tender              Adnexa: normal adnexa and no mass, fullness, tenderness               Rectovaginal: Confirms               Anus:  normal sphincter  tone, no lesions  A:  Well Woman with normal exam H/O melanoma Vit D deficiency Mild osteopenia Yeast vaginitis  P:   Mammogram and BMD to be scheduled at [facility].  pap smear with neg HR HPV 2013.  Not indicated this year. Vit D today.  No other labs needed.  Rx for 50K every other weekly to local pharmacy. Diflucan 150mg  x 1 and repeat in 72 hours.  OTC products discussed.   return annually or prn  An After Visit Summary was printed and given to the patient.

## 2013-10-27 NOTE — Patient Instructions (Addendum)

## 2013-10-28 ENCOUNTER — Telehealth: Payer: Self-pay

## 2013-10-28 LAB — VITAMIN D 25 HYDROXY (VIT D DEFICIENCY, FRACTURES): Vit D, 25-Hydroxy: 29 ng/mL — ABNORMAL LOW (ref 30–89)

## 2013-10-28 NOTE — Telephone Encounter (Signed)
Message copied by Elisha Headland on Tue Oct 28, 2013  4:38 PM ------      Message from: Jerene Bears      Created: Tue Oct 28, 2013  9:53 AM       Inform level low.  Go back to 50,000 IU weekly.  Rx done to pharmacy yesterday. ------

## 2013-10-28 NOTE — Telephone Encounter (Signed)
lmtcb

## 2013-11-04 NOTE — Telephone Encounter (Signed)
Patient notified of lab results

## 2013-11-24 ENCOUNTER — Other Ambulatory Visit: Payer: Self-pay | Admitting: Obstetrics & Gynecology

## 2013-11-24 NOTE — Telephone Encounter (Signed)
10/27/13 #26/3 refills was sent to pharmacy  Denied.

## 2014-01-16 ENCOUNTER — Telehealth: Payer: Self-pay | Admitting: Obstetrics & Gynecology

## 2014-01-16 NOTE — Telephone Encounter (Signed)
Pt says she is returning kelly's call. No message in system.

## 2014-01-16 NOTE — Telephone Encounter (Signed)
Return call to patient, thinks she is calling [facility] about BMD.  Advised of results as instructed by Dr Sabra Heck. (see scanned report)  Patient asking for clarification on Vit D, instructed to take 50,000 IU Q week (see lab result from 10-2013).   Routing to provider for final review. Patient agreeable to disposition. Will close encounter

## 2014-01-26 ENCOUNTER — Encounter: Payer: Self-pay | Admitting: Internal Medicine

## 2014-02-19 ENCOUNTER — Encounter: Payer: Self-pay | Admitting: Obstetrics & Gynecology

## 2014-05-02 ENCOUNTER — Other Ambulatory Visit: Payer: Self-pay | Admitting: Obstetrics & Gynecology

## 2014-05-13 ENCOUNTER — Other Ambulatory Visit: Payer: Self-pay | Admitting: Obstetrics & Gynecology

## 2014-05-27 ENCOUNTER — Other Ambulatory Visit: Payer: Self-pay | Admitting: Obstetrics & Gynecology

## 2014-05-27 NOTE — Telephone Encounter (Signed)
Pt is requesting a refill for Vit D. Pt wants to discuss this prescription with the nurse. Please send to [facility] at [place].

## 2014-05-27 NOTE — Telephone Encounter (Signed)
10/27/13 AEX #26/3 refills was sent to pharmacy   S/w pharmacist they did have a rx from 10/27/13 #26 but with no refills  Called in #26/1 refill    AEX scheduled for 12/07/14  S/w patient she is aware that rx has been sent.

## 2014-12-07 ENCOUNTER — Ambulatory Visit (INDEPENDENT_AMBULATORY_CARE_PROVIDER_SITE_OTHER): Payer: BLUE CROSS/BLUE SHIELD | Admitting: Obstetrics & Gynecology

## 2014-12-07 ENCOUNTER — Encounter: Payer: Self-pay | Admitting: Obstetrics & Gynecology

## 2014-12-07 ENCOUNTER — Other Ambulatory Visit (INDEPENDENT_AMBULATORY_CARE_PROVIDER_SITE_OTHER): Payer: BLUE CROSS/BLUE SHIELD

## 2014-12-07 ENCOUNTER — Telehealth: Payer: Self-pay | Admitting: Obstetrics & Gynecology

## 2014-12-07 VITALS — BP 102/62 | HR 68 | Resp 16 | Ht 62.5 in | Wt 169.4 lb

## 2014-12-07 DIAGNOSIS — Z01419 Encounter for gynecological examination (general) (routine) without abnormal findings: Secondary | ICD-10-CM

## 2014-12-07 DIAGNOSIS — Z0000 Encounter for general adult medical examination without abnormal findings: Secondary | ICD-10-CM

## 2014-12-07 DIAGNOSIS — Z124 Encounter for screening for malignant neoplasm of cervix: Secondary | ICD-10-CM

## 2014-12-07 DIAGNOSIS — Z23 Encounter for immunization: Secondary | ICD-10-CM

## 2014-12-07 LAB — POCT URINALYSIS DIPSTICK
Bilirubin, UA: NEGATIVE
Glucose, UA: NEGATIVE
KETONES UA: NEGATIVE
Nitrite, UA: NEGATIVE
Urobilinogen, UA: NEGATIVE
pH, UA: 5

## 2014-12-07 NOTE — Telephone Encounter (Signed)
Spoke with patient.  She would like additional lab work drawn today because she is fasting and has appointment with Dr. Sabra Heck for annual exam.  Patient states she usually has vitamin D drawn but would like to have full health panel drawn.  Advised can discuss with Dr. Sabra Heck what types of labs she would like to have run at time of office visit.  Advised can come in for lab draw while fasting and discuss types of labs with Dr. Sabra Heck at office visit.  Patient would like to come in now for lab draw. Appointment scheduled.   Routing to provider for final review. Patient agreeable to disposition. Will close encounter   Pended orders for Lipid profile, CMET, Vitamin D, Tsh

## 2014-12-07 NOTE — Patient Instructions (Signed)
Natalie [name], [place]

## 2014-12-07 NOTE — Telephone Encounter (Signed)
Pt has appointment today and asked to come in to have fasting labs done. Pt requests to have 'more of a complete panel done.'  She asked to talk specifically to Dr Sabra Heck or her nurse to go over what she should have taken. Pt asks for call as soon as possible because she is fasting.  Tagging as high priority based on patients appointment this afternoon. bf

## 2014-12-07 NOTE — Progress Notes (Signed)
59 y.o. G0P0000 SingleCaucasianF here for annual exam.  No vaginal bleeding.  Doing well.  Was in [place].  Lots of questions.  Still seeing dermatologist yearly.  Would like a recommendation about other possible options.    Patient's last menstrual period was 03/05/2007.          Sexually active: No.  The current method of family planning is post menopausal status.    Exercising: Yes.    walking and eliptical Smoker:  no  Health Maintenance: Pap:  08/19/12 WNL/negative HR HPV History of abnormal Pap:  no MMG:  01/05/14-normal Colonoscopy:  2/15-repeat in 10 years BMD:   01/05/14-osteopenia, -1.9 TDaP:  2006 Screening Labs: PCP, Hb today: 14.5, Urine today: WBC-trace, PROTEIN-trace, RBC-trace   reports that she has never smoked. She has never used smokeless tobacco. She reports that she drinks alcohol. She reports that she does not use illicit drugs.  Past Medical History  Diagnosis Date  . Hx of malignant melanoma 02/28/2013    2003 followed by derm.    . Palpitations   . Hx of breast biopsy 2000  . Anemia   . Cervical polyp 2007  . Vitamin D deficiency   . Osteopenia     mild    Past Surgical History  Procedure Laterality Date  . Tonsillectomy  1960  . Breast lumpectomy  1984    left breast    Current Outpatient Prescriptions  Medication Sig Dispense Refill  . Calcium Carbonate Antacid (TUMS PO) Take by mouth as needed.    . Calcium-Magnesium-Vitamin D (CALCIUM 500 PO) Take by mouth 2 (two) times daily.    . ergocalciferol (VITAMIN D2) 50000 UNITS capsule Take 1 capsule (50,000 Units total) by mouth once a week. 26 capsule 3  . Multiple Vitamins-Minerals (MULTIVITAMIN PO) Take by mouth daily.    . Omega-3 Fatty Acids (FISH OIL PO) Take by mouth.    . B Complex Vitamins (VITAMIN B COMPLEX PO) Take by mouth daily.     No current facility-administered medications for this visit.    Family History  Problem Relation Age of Onset  . Hypertension Paternal Uncle   .  Lung cancer    . Stroke    . Other      mom viral meningitis  . Osteoarthritis    . Alcoholism    . Breast cancer Paternal Aunt   . Breast cancer Other     paternal great aunt    ROS:  Pertinent items are noted in HPI.  Otherwise, a comprehensive ROS was negative.  Exam:   BP 102/62 mmHg  Pulse 68  Resp 16  Ht 5' 2.5" (1.588 m)  Wt 169 lb 6.4 oz (76.839 kg)  BMI 30.47 kg/m2  LMP 03/05/2007  Weight change: -4#   Height: 5' 2.5" (158.8 cm)  Ht Readings from Last 3 Encounters:  12/07/14 5' 2.5" (1.588 m)  10/27/13 5' 2.5" (1.588 m)  06/25/13 5\' 3"  (1.6 m)    General appearance: alert, cooperative and appears stated age Head: Normocephalic, without obvious abnormality, atraumatic Neck: no adenopathy, supple, symmetrical, trachea midline and thyroid normal to inspection and palpation Lungs: clear to auscultation bilaterally Breasts: normal appearance, no masses or tenderness Heart: regular rate and rhythm Abdomen: soft, non-tender; bowel sounds normal; no masses,  no organomegaly Extremities: extremities normal, atraumatic, no cyanosis or edema Skin: Skin color, texture, turgor normal. No rashes or lesions Lymph nodes: Cervical, supraclavicular, and axillary nodes normal. No abnormal inguinal nodes palpated Neurologic:  Grossly normal   Pelvic: External genitalia:  no lesions              Urethra:  normal appearing urethra with no masses, tenderness or lesions              Bartholins and Skenes: normal                 Vagina: normal appearing vagina with normal color and discharge, no lesions              Cervix: no lesions              Pap taken: Yes.   Bimanual Exam:  Uterus:  normal size, contour, position, consistency, mobility, non-tender              Adnexa: normal adnexa and no mass, fullness, tenderness               Rectovaginal: Confirms               Anus:  normal sphincter tone, no lesions  Chaperone was present for exam.  A:  Well Woman with normal  exam H/O [facility] ([facility] Vit D deficiency Mild osteopenia   P: Mammogram yearly pap smear with neg HR HPV 2013.  Pap obtained today. Vit D today.Vit 50K weekly.  Will need rx when labs are back.  Repeat BMD 3-4 year CMP, TSH, Vit D, Lipids (fasting) Tdap today return annually or prn

## 2014-12-08 LAB — LIPID PANEL
CHOL/HDL RATIO: 2.5 ratio
Cholesterol: 205 mg/dL — ABNORMAL HIGH (ref 0–200)
HDL: 82 mg/dL (ref >39)
LDL CALC: 111 mg/dL — AB (ref 0–99)
Triglycerides: 59 mg/dL (ref <150)
VLDL: 12 mg/dL (ref 0–40)

## 2014-12-08 LAB — COMPREHENSIVE METABOLIC PANEL
ALT: 17 U/L (ref 0–35)
AST: 21 U/L (ref 0–37)
Albumin: 4.2 g/dL (ref 3.5–5.2)
Alkaline Phosphatase: 61 U/L (ref 39–117)
BILIRUBIN TOTAL: 0.6 mg/dL (ref 0.2–1.2)
BUN: 20 mg/dL (ref 6–23)
CHLORIDE: 106 meq/L (ref 96–112)
CO2: 27 meq/L (ref 19–32)
Calcium: 9.6 mg/dL (ref 8.4–10.5)
Creat: 0.83 mg/dL (ref 0.50–1.10)
Glucose, Bld: 86 mg/dL (ref 70–99)
POTASSIUM: 4.2 meq/L (ref 3.5–5.3)
Sodium: 140 mEq/L (ref 135–145)
Total Protein: 6.5 g/dL (ref 6.0–8.3)

## 2014-12-08 LAB — TSH: TSH: 1.487 u[IU]/mL (ref 0.350–4.500)

## 2014-12-08 LAB — VITAMIN D 25 HYDROXY (VIT D DEFICIENCY, FRACTURES): Vit D, 25-Hydroxy: 33 ng/mL (ref 30–100)

## 2014-12-08 LAB — HEMOGLOBIN, FINGERSTICK: Hemoglobin, fingerstick: 14.5 g/dL (ref 12.0–16.0)

## 2014-12-08 MED ORDER — ERGOCALCIFEROL 1.25 MG (50000 UT) PO CAPS: 50000.0000 [IU] | ORAL_CAPSULE | ORAL | Status: DC

## 2014-12-08 NOTE — Addendum Note (Signed)
Addended by: Megan Salon on: 12/08/2014 10:51 AM   Modules accepted: Orders, SmartSet

## 2014-12-10 ENCOUNTER — Telehealth: Payer: Self-pay

## 2014-12-10 NOTE — Telephone Encounter (Signed)
Lmtcb//kn 

## 2014-12-10 NOTE — Telephone Encounter (Signed)
-----   Message from Lyman Speller, MD sent at 12/08/2014 10:50 AM EST ----- Inform lipids are fairly stable over last several years.  Total 205 (was 193 six years ago).  LDLs a little elevated at 111 but this is very stable.  HDLs are great at 82.  TGs normal.  No treatment needed.  Repeat 3-4 years.  CMP normal.  TSH normal.  Vit D 33.  Continue prescription.  Rx sent to pharmacy.

## 2014-12-11 LAB — IPS PAP TEST WITH REFLEX TO HPV

## 2014-12-14 NOTE — Telephone Encounter (Signed)
Patient notified of all results.//kn 

## 2015-02-08 ENCOUNTER — Encounter: Payer: Self-pay | Admitting: Internal Medicine

## 2015-02-08 ENCOUNTER — Ambulatory Visit (INDEPENDENT_AMBULATORY_CARE_PROVIDER_SITE_OTHER): Payer: BLUE CROSS/BLUE SHIELD | Admitting: Internal Medicine

## 2015-02-08 VITALS — BP 128/86 | HR 74 | Temp 98.7°F | Ht 62.5 in | Wt 169.2 lb

## 2015-02-08 DIAGNOSIS — J069 Acute upper respiratory infection, unspecified: Secondary | ICD-10-CM

## 2015-02-08 DIAGNOSIS — Z7184 Encounter for health counseling related to travel: Secondary | ICD-10-CM

## 2015-02-08 DIAGNOSIS — J320 Chronic maxillary sinusitis: Secondary | ICD-10-CM

## 2015-02-08 DIAGNOSIS — Z7189 Other specified counseling: Secondary | ICD-10-CM

## 2015-02-08 MED ORDER — AMOXICILLIN-POT CLAVULANATE 875-125 MG PO TABS: 1.0000 | ORAL_TABLET | Freq: Two times a day (BID) | ORAL | Status: DC

## 2015-02-08 NOTE — Patient Instructions (Signed)
Decongestant    Can help with sinusitis   If  persistent or progressive pain localization of fever can add antibiotic   After better can get hep a vaccine  Send ups message about details of travel location and needs  To order thyroid malaria  Diarrhea etc  Prevention.  Yellow fever vaccine have to get from health department or travel clinic    Sinusitis Sinusitis is redness, soreness, and inflammation of the paranasal sinuses. Paranasal sinuses are air pockets within the bones of your face (beneath the eyes, the middle of the forehead, or above the eyes). In healthy paranasal sinuses, mucus is able to drain out, and air is able to circulate through them by way of your nose. However, when your paranasal sinuses are inflamed, mucus and air can become trapped. This can allow bacteria and other germs to grow and cause infection. Sinusitis can develop quickly and last only a short time (acute) or continue over a long period (chronic). Sinusitis that lasts for more than 12 weeks is considered chronic.  CAUSES  Causes of sinusitis include:  Allergies.  Structural abnormalities, such as displacement of the cartilage that separates your nostrils (deviated septum), which can decrease the air flow through your nose and sinuses and affect sinus drainage.  Functional abnormalities, such as when the small hairs (cilia) that line your sinuses and help remove mucus do not work properly or are not present. SIGNS AND SYMPTOMS  Symptoms of acute and chronic sinusitis are the same. The primary symptoms are pain and pressure around the affected sinuses. Other symptoms include:  Upper toothache.  Earache.  Headache.  Bad breath.  Decreased sense of smell and taste.  A cough, which worsens when you are lying flat.  Fatigue.  Fever.  Thick drainage from your nose, which often is green and may contain pus (purulent).  Swelling and warmth over the affected sinuses. DIAGNOSIS  Your health care provider  will perform a physical exam. During the exam, your health care provider may:  Look in your nose for signs of abnormal growths in your nostrils (nasal polyps).  Tap over the affected sinus to check for signs of infection.  View the inside of your sinuses (endoscopy) using an imaging device that has a light attached (endoscope). If your health care provider suspects that you have chronic sinusitis, one or more of the following tests may be recommended:  Allergy tests.  Nasal culture. A sample of mucus is taken from your nose, sent to a lab, and screened for bacteria.  Nasal cytology. A sample of mucus is taken from your nose and examined by your health care provider to determine if your sinusitis is related to an allergy. TREATMENT  Most cases of acute sinusitis are related to a viral infection and will resolve on their own within 10 days. Sometimes medicines are prescribed to help relieve symptoms (pain medicine, decongestants, nasal steroid sprays, or saline sprays).  However, for sinusitis related to a bacterial infection, your health care provider will prescribe antibiotic medicines. These are medicines that will help kill the bacteria causing the infection.  Rarely, sinusitis is caused by a fungal infection. In theses cases, your health care provider will prescribe antifungal medicine. For some cases of chronic sinusitis, surgery is needed. Generally, these are cases in which sinusitis recurs more than 3 times per year, despite other treatments. HOME CARE INSTRUCTIONS   Drink plenty of water. Water helps thin the mucus so your sinuses can drain more easily.  Use  a humidifier.  Inhale steam 3 to 4 times a day (for example, sit in the bathroom with the shower running).  Apply a warm, moist washcloth to your face 3 to 4 times a day, or as directed by your health care provider.  Use saline nasal sprays to help moisten and clean your sinuses.  Take medicines only as directed by your  health care provider.  If you were prescribed either an antibiotic or antifungal medicine, finish it all even if you start to feel better. SEEK IMMEDIATE MEDICAL CARE IF:  You have increasing pain or severe headaches.  You have nausea, vomiting, or drowsiness.  You have swelling around your face.  You have vision problems.  You have a stiff neck.  You have difficulty breathing. MAKE SURE YOU:   Understand these instructions.  Will watch your condition.  Will get help right away if you are not doing well or get worse. Document Released: 11/20/2005 Document Revised: 04/06/2014 Document Reviewed: 12/05/2011 [facility] Patient Information 2015 [place], [place]. This information is not intended to replace advice given to you by your health care provider. Make sure you discuss any questions you have with your health care provider.

## 2015-02-08 NOTE — Progress Notes (Signed)
Pre visit review using our clinic review tool, if applicable. No additional management support is needed unless otherwise documented below in the visit note.  Chief Complaint  Patient presents with  . Cough  . Sore Throat  . Headache  . Nasal Congestion  . Shortness of Breath    HPI: Patient Natalie [name]  comes in today for SDA for  new problem evaluation. osnet of above sx for about a week  And still continues  Breathing? Tight  Right side of face and teeth pain yesterday althgouh some better today  Taking mucinex D   also has ? About trip overseas [place] in [place] . Immunizations  And meds  ROS: See pertinent positives and negatives per HPI. No fever chills hemoptysis  Past Medical History  Diagnosis Date  . Hx of malignant melanoma 02/28/2013    2003 followed by derm.    . Palpitations   . Hx of breast biopsy 2000  . Anemia   . Cervical polyp 2007  . Vitamin D deficiency   . Osteopenia     mild    Family History  Problem Relation Age of Onset  . Hypertension Paternal Uncle   . Lung cancer    . Stroke    . Other      mom viral meningitis  . Osteoarthritis    . Alcoholism    . Breast cancer Paternal Aunt   . Breast cancer Other     paternal great aunt    History   Social History  . Marital Status: Single    Spouse Name: N/A  . Number of Children: N/A  . Years of Education: N/A   Social History Main Topics  . Smoking status: Never Smoker   . Smokeless tobacco: Never Used  . Alcohol Use: 0.0 - 1.0 oz/week    0-2 drink(s) per week     Comment: occ  . Drug Use: No  . Sexual Activity: No   Other Topics Concern  . None   Social History Narrative   hh of 2    Neg td    PHD sports and exercise science    Works as Physical therapist    Has been PE teacher     Outpatient Encounter Prescriptions as of 02/08/2015  Medication Sig  . B Complex Vitamins (VITAMIN B COMPLEX PO) Take by mouth daily.  . Calcium Carbonate Antacid (TUMS PO) Take by mouth  as needed.  . Calcium-Magnesium-Vitamin D (CALCIUM 500 PO)   . ergocalciferol (VITAMIN D2) 50000 UNITS capsule Take 1 capsule (50,000 Units total) by mouth once a week.  . Multiple Vitamins-Minerals (MULTIVITAMIN PO) Take by mouth daily.  . Omega-3 Fatty Acids (FISH OIL PO) Take by mouth.  Marland Kitchen amoxicillin-clavulanate (AUGMENTIN) 875-125 MG per tablet Take 1 tablet by mouth every 12 (twelve) hours.    EXAM:  BP 128/86 mmHg  Pulse 74  Temp(Src) 98.7 F (37.1 C) (Oral)  Ht 5' 2.5" (1.588 m)  Wt 169 lb 3.2 oz (76.749 kg)  BMI 30.43 kg/m2  SpO2 98%  LMP 03/05/2007  Body mass index is 30.43 kg/(m^2). WDWN in NAD  quiet respirations; mildly congested  somewhat hoarse. Non toxic . HEENT: Normocephalic ;atraumatic , Eyes;  PERRL, EOMs  Full, lids and conjunctiva clear,,Ears: no deformities, canals nl, TM landmarks normal, Nose: no deformity or discharge but congested;face  Right cheek minimally tender Mouth : OP clear without lesion or edema . Neck: Supple without adenopathy or masses or bruits tender  right ac  Chest:  Clear to A&P without wheezes rales or rhonchi CV:  S1-S2 no gallops or murmurs peripheral perfusion is normal Skin :nl perfusion and no acute rashes  MS: moves all extremities without noticeable focal  abnormality PSYCH: pleasant and cooperative, no obvious depression or anxiety  ASSESSMENT AND PLAN:  Discussed the following assessment and plan:  Right maxillary sinusitis  URI, acute  Counseling about travel - can get hep a call with dates and info hep a thyphoid malaria TD and poss altitude prophylaxis,yellow fever at hd  -Patient advised to return or notify health care team  if symptoms worsen ,persist or new concerns arise.  Patient Instructions  Decongestant    Can help with sinusitis   If  persistent or progressive pain localization of fever can add antibiotic   After better can get hep a vaccine  Send ups message about details of travel location and needs  To  order thyroid malaria  Diarrhea etc  Prevention.  Yellow fever vaccine have to get from health department or travel clinic    Sinusitis Sinusitis is redness, soreness, and inflammation of the paranasal sinuses. Paranasal sinuses are air pockets within the bones of your face (beneath the eyes, the middle of the forehead, or above the eyes). In healthy paranasal sinuses, mucus is able to drain out, and air is able to circulate through them by way of your nose. However, when your paranasal sinuses are inflamed, mucus and air can become trapped. This can allow bacteria and other germs to grow and cause infection. Sinusitis can develop quickly and last only a short time (acute) or continue over a long period (chronic). Sinusitis that lasts for more than 12 weeks is considered chronic.  CAUSES  Causes of sinusitis include:  Allergies.  Structural abnormalities, such as displacement of the cartilage that separates your nostrils (deviated septum), which can decrease the air flow through your nose and sinuses and affect sinus drainage.  Functional abnormalities, such as when the small hairs (cilia) that line your sinuses and help remove mucus do not work properly or are not present. SIGNS AND SYMPTOMS  Symptoms of acute and chronic sinusitis are the same. The primary symptoms are pain and pressure around the affected sinuses. Other symptoms include:  Upper toothache.  Earache.  Headache.  Bad breath.  Decreased sense of smell and taste.  A cough, which worsens when you are lying flat.  Fatigue.  Fever.  Thick drainage from your nose, which often is green and may contain pus (purulent).  Swelling and warmth over the affected sinuses. DIAGNOSIS  Your health care provider will perform a physical exam. During the exam, your health care provider may:  Look in your nose for signs of abnormal growths in your nostrils (nasal polyps).  Tap over the affected sinus to check for signs of  infection.  View the inside of your sinuses (endoscopy) using an imaging device that has a light attached (endoscope). If your health care provider suspects that you have chronic sinusitis, one or more of the following tests may be recommended:  Allergy tests.  Nasal culture. A sample of mucus is taken from your nose, sent to a lab, and screened for bacteria.  Nasal cytology. A sample of mucus is taken from your nose and examined by your health care provider to determine if your sinusitis is related to an allergy. TREATMENT  Most cases of acute sinusitis are related to a viral infection and will resolve on their own  within 10 days. Sometimes medicines are prescribed to help relieve symptoms (pain medicine, decongestants, nasal steroid sprays, or saline sprays).  However, for sinusitis related to a bacterial infection, your health care provider will prescribe antibiotic medicines. These are medicines that will help kill the bacteria causing the infection.  Rarely, sinusitis is caused by a fungal infection. In theses cases, your health care provider will prescribe antifungal medicine. For some cases of chronic sinusitis, surgery is needed. Generally, these are cases in which sinusitis recurs more than 3 times per year, despite other treatments. HOME CARE INSTRUCTIONS   Drink plenty of water. Water helps thin the mucus so your sinuses can drain more easily.  Use a humidifier.  Inhale steam 3 to 4 times a day (for example, sit in the bathroom with the shower running).  Apply a warm, moist washcloth to your face 3 to 4 times a day, or as directed by your health care provider.  Use saline nasal sprays to help moisten and clean your sinuses.  Take medicines only as directed by your health care provider.  If you were prescribed either an antibiotic or antifungal medicine, finish it all even if you start to feel better. SEEK IMMEDIATE MEDICAL CARE IF:  You have increasing pain or severe  headaches.  You have nausea, vomiting, or drowsiness.  You have swelling around your face.  You have vision problems.  You have a stiff neck.  You have difficulty breathing. MAKE SURE YOU:   Understand these instructions.  Will watch your condition.  Will get help right away if you are not doing well or get worse. Document Released: 11/20/2005 Document Revised: 04/06/2014 Document Reviewed: 12/05/2011 [facility] Patient Information 2015 [place], [place]. This information is not intended to replace advice given to you by your health care provider. Make sure you discuss any questions you have with your health care provider.      Standley Brooking. Panosh M.D.

## 2015-02-22 ENCOUNTER — Ambulatory Visit (INDEPENDENT_AMBULATORY_CARE_PROVIDER_SITE_OTHER): Payer: BLUE CROSS/BLUE SHIELD | Admitting: Family Medicine

## 2015-02-22 ENCOUNTER — Telehealth: Payer: Self-pay | Admitting: Family Medicine

## 2015-02-22 DIAGNOSIS — Z23 Encounter for immunization: Secondary | ICD-10-CM

## 2015-02-22 NOTE — Telephone Encounter (Signed)
Pt come in today for her first hep a injection.  Stated she has had the hep b in the past.  She will be going to [place] in May.  She is leaving on Apr 19, 2015 and returning on May 04, 2015.  She would like an anti malaria medication, medication for travelers diarrhea and something for altitude sickness.  Please advise.  When should the pt return for her second Hep A?  Would like to come on on Monday.

## 2015-02-28 ENCOUNTER — Encounter: Payer: Self-pay | Admitting: Internal Medicine

## 2015-03-11 MED ORDER — CIPROFLOXACIN HCL 500 MG PO TABS: 500.0000 mg | ORAL_TABLET | Freq: Two times a day (BID) | ORAL | Status: DC

## 2015-03-11 MED ORDER — ACETAZOLAMIDE 250 MG PO TABS: 250.0000 mg | ORAL_TABLET | Freq: Two times a day (BID) | ORAL | Status: DC

## 2015-03-11 MED ORDER — DOXYCYCLINE HYCLATE 100 MG PO TABS: ORAL_TABLET | ORAL | Status: DC

## 2015-03-11 NOTE — Telephone Encounter (Signed)
Sending in malaria  Prophylaxis ( Although usually only need  in the jungle type areas)  Diarrhea travelers antibiotic if needed   Altitude sickness  Medication  to use  If gets sx .   Risk is: rapidity of ascent and where you sleep at night ..underlying diseases that you do not have.  Oxygen not really indicated . Fluids ,hydration  And conditioning. Descending if  Needed. Is best .  Most people  Do well    Will send in  Diamox to take  250 mg twice a day if needed for altitude sickness to take until symptoms subside or descent. .  Check [email or web address] patient info on altitude sickness

## 2015-04-01 ENCOUNTER — Telehealth: Payer: Self-pay | Admitting: Family Medicine

## 2015-04-01 NOTE — Telephone Encounter (Signed)
LM for Tedra to return my call.

## 2015-04-01 NOTE — Telephone Encounter (Signed)
-----   Message from Burnis Medin, MD sent at 03/30/2015  7:45 PM EDT ----- Regarding: Did they get their travel needs and rxes before the May trip? Also Natalie [name] dob [birth year] her partner .   I had sent in meds for [name] but dont see a  call to her about this .  Please check to see if either needs any further rx ( can you send them messages  on My chart?)  I didn't send in any meds in  for [name]. Just [name] .( Maybe she got what  was needed at the HD)  Thanks [facility]

## 2015-04-02 NOTE — Telephone Encounter (Signed)
Natalie [name] left me a message.  Natalie [name] received malarone and cipro.  Natalie [name] did not receive the malarone but did get the medication for altitude sickness and cipro.  Need to send in Natalie [name] for Natalie [name].

## 2015-04-02 NOTE — Telephone Encounter (Signed)
Natalie [name] called back.  She also need altitude sickness medication.  They will traveling with Pepto.  Colin Broach has read that this may cause doxycycline to become less effective.  She has also read that bacteria is becoming resistant to cipro and that azithromycin is recommended.

## 2015-04-04 MED ORDER — ATOVAQUONE-PROGUANIL HCL 250-100 MG PO TABS: 1.0000 | ORAL_TABLET | Freq: Every day | ORAL | Status: DC

## 2015-04-04 MED ORDER — AZITHROMYCIN 500 MG PO TABS: 1000.0000 mg | ORAL_TABLET | Freq: Every day | ORAL | Status: DC

## 2015-04-04 NOTE — Telephone Encounter (Signed)
All of this is true  But prob not  Not going to be an issue... But no guarantees    There is also resistance to azithro but   [place]   I f prefer malarone  Then can rx this   Misty please   rx  For lavon  Same rx for diamox in her record. And  axithro 500 mg disp 2 take 2 po prn travelers diarrhea  Will send in  malarone and  azithro 500 mg take 2 po x 2 disp 2

## 2015-04-05 NOTE — Telephone Encounter (Signed)
Spoke to [employer].  Informed her of all.  Rx sent to the pharmacy by e-scribe.

## 2015-04-07 ENCOUNTER — Telehealth: Payer: Self-pay | Admitting: Family Medicine

## 2015-04-07 NOTE — Telephone Encounter (Signed)
Patient called and left a message on my machine.  Stating she had questions about her medication that was sent in for her trip out of the country.  Requesting a call back.

## 2015-04-07 NOTE — Telephone Encounter (Signed)
Spoke to [place].  Explained the Diamox will be for altitude sickness.  Malarone is for the anti malaria and two prescriptions has been sent in for travelers diarrhea.  She should choose to take the azithromycin or cipro but she should NOT take both.  Pt understands.  No further questions at this time.

## 2015-04-09 ENCOUNTER — Telehealth: Payer: Self-pay | Admitting: Family Medicine

## 2015-04-09 NOTE — Telephone Encounter (Signed)
Called and left a message on patient's cell informing her that the azithromycin is a one time dose for travelers diarrhea.  Instructed her to call back if she had any further questions.

## 2015-04-09 NOTE — Telephone Encounter (Signed)
Yes this is the dose if needed  for travelers diarrhea  One time dose

## 2015-04-09 NOTE — Telephone Encounter (Signed)
Pt calling to confirm that azithromycin (ZITHROMAX) 500 MG tablet is only 2 tablets.  Only two tablets sent to the pharmacy.

## 2015-06-04 DIAGNOSIS — S5292XA Unspecified fracture of left forearm, initial encounter for closed fracture: Secondary | ICD-10-CM

## 2015-06-04 HISTORY — DX: Unspecified fracture of left forearm, initial encounter for closed fracture: S52.92XA

## 2015-06-14 ENCOUNTER — Emergency Department (HOSPITAL_COMMUNITY): Payer: BLUE CROSS/BLUE SHIELD

## 2015-06-14 ENCOUNTER — Emergency Department (HOSPITAL_COMMUNITY)
Admission: EM | Admit: 2015-06-14 | Discharge: 2015-06-14 | Disposition: A | Discharge status: Home / Self Care | Payer: BLUE CROSS/BLUE SHIELD | Attending: Emergency Medicine | Admitting: Emergency Medicine

## 2015-06-14 ENCOUNTER — Encounter (HOSPITAL_COMMUNITY): Payer: Self-pay

## 2015-06-14 DIAGNOSIS — Z862 Personal history of diseases of the blood and blood-forming organs and certain disorders involving the immune mechanism: Secondary | ICD-10-CM | POA: Diagnosis not present

## 2015-06-14 DIAGNOSIS — Y999 Unspecified external cause status: Secondary | ICD-10-CM | POA: Insufficient documentation

## 2015-06-14 DIAGNOSIS — Y9289 Other specified places as the place of occurrence of the external cause: Secondary | ICD-10-CM | POA: Diagnosis not present

## 2015-06-14 DIAGNOSIS — Z792 Long term (current) use of antibiotics: Secondary | ICD-10-CM | POA: Diagnosis not present

## 2015-06-14 DIAGNOSIS — Z8639 Personal history of other endocrine, nutritional and metabolic disease: Secondary | ICD-10-CM | POA: Insufficient documentation

## 2015-06-14 DIAGNOSIS — W1839XA Other fall on same level, initial encounter: Secondary | ICD-10-CM | POA: Insufficient documentation

## 2015-06-14 DIAGNOSIS — Z85820 Personal history of malignant melanoma of skin: Secondary | ICD-10-CM | POA: Diagnosis not present

## 2015-06-14 DIAGNOSIS — Z79899 Other long term (current) drug therapy: Secondary | ICD-10-CM | POA: Insufficient documentation

## 2015-06-14 DIAGNOSIS — Y9389 Activity, other specified: Secondary | ICD-10-CM | POA: Diagnosis not present

## 2015-06-14 DIAGNOSIS — Z8742 Personal history of other diseases of the female genital tract: Secondary | ICD-10-CM | POA: Insufficient documentation

## 2015-06-14 DIAGNOSIS — S52502A Unspecified fracture of the lower end of left radius, initial encounter for closed fracture: Secondary | ICD-10-CM | POA: Diagnosis not present

## 2015-06-14 DIAGNOSIS — S52602A Unspecified fracture of lower end of left ulna, initial encounter for closed fracture: Secondary | ICD-10-CM | POA: Diagnosis not present

## 2015-06-14 DIAGNOSIS — S6992XA Unspecified injury of left wrist, hand and finger(s), initial encounter: Secondary | ICD-10-CM | POA: Diagnosis present

## 2015-06-14 MED ORDER — ONDANSETRON 8 MG PO TBDP
8.0000 mg | ORAL_TABLET | Freq: Once | ORAL | Status: AC
  Administered 2015-06-14: 8 mg via ORAL
  Filled 2015-06-14: qty 1

## 2015-06-14 MED ORDER — OXYCODONE-ACETAMINOPHEN 5-325 MG PO TABS
2.0000 | ORAL_TABLET | Freq: Once | ORAL | Status: AC
  Administered 2015-06-14: 2 via ORAL
  Filled 2015-06-14: qty 2

## 2015-06-14 MED ORDER — OXYCODONE-ACETAMINOPHEN 5-325 MG PO TABS: 1.0000 | ORAL_TABLET | Freq: Four times a day (QID) | ORAL | Status: DC | PRN

## 2015-06-14 NOTE — ED Notes (Signed)
Ortho Tech at bedside.  

## 2015-06-14 NOTE — Discharge Instructions (Signed)
Forearm Fracture Your caregiver has diagnosed you as having a broken bone (fracture) of the forearm. This is the part of your arm between the elbow and your wrist. Your forearm is made up of two bones. These are the radius and ulna. A fracture is a break in one or both bones. A cast or splint is used to protect and keep your injured bone from moving. The cast or splint will be on generally for about 5 to 6 weeks, with individual variations. HOME CARE INSTRUCTIONS   Keep the injured part elevated while sitting or lying down. Keeping the injury above the level of your heart (the center of the chest). This will decrease swelling and pain.  Apply ice to the injury for 15-20 minutes, 03-04 times per day while awake, for 2 days. Put the ice in a plastic bag and place a thin towel between the bag of ice and your cast or splint.  If you have a plaster or fiberglass cast:  Do not try to scratch the skin under the cast using sharp or pointed objects.  Check the skin around the cast every day. You may put lotion on any red or sore areas.  Keep your cast dry and clean.  If you have a plaster splint:  Wear the splint as directed.  You may loosen the elastic around the splint if your fingers become numb, tingle, or turn cold or blue.  Do not put pressure on any part of your cast or splint. It may break. Rest your cast only on a pillow the first 24 hours until it is fully hardened.  Your cast or splint can be protected during bathing with a plastic bag. Do not lower the cast or splint into water.  Only take over-the-counter or prescription medicines for pain, discomfort, or fever as directed by your caregiver. SEEK IMMEDIATE MEDICAL CARE IF:   Your cast gets damaged or breaks.  You have more severe pain or swelling than you did before the cast.  Your skin or nails below the injury turn blue or gray, or feel cold or numb.  There is a bad smell or new stains and/or pus like (purulent) drainage  coming from under the cast. MAKE SURE YOU:   Understand these instructions.  Will watch your condition.  Will get help right away if you are not doing well or get worse. Document Released: 11/17/2000 Document Revised: 02/12/2012 Document Reviewed: 07/09/2008 [facility] Patient Information 2015 [place], [place]. This information is not intended to replace advice given to you by your health care provider. Make sure you discuss any questions you have with your health care provider.  Cast or Splint Care Casts and splints support injured limbs and keep bones from moving while they heal.  HOME CARE  Keep the cast or splint uncovered during the drying period.  A plaster cast can take 24 to 48 hours to dry.  A fiberglass cast will dry in less than 1 hour.  Do not rest the cast on anything harder than a pillow for 24 hours.  Do not put weight on your injured limb. Do not put pressure on the cast. Wait for your doctor's approval.  Keep the cast or splint dry.  Cover the cast or splint with a plastic bag during baths or wet weather.  If you have a cast over your chest and belly (trunk), take sponge baths until the cast is taken off.  If your cast gets wet, dry it with a towel or blow  dryer. Use the cool setting on the blow dryer.  Keep your cast or splint clean. Wash a dirty cast with a damp cloth.  Do not put any objects under your cast or splint.  Do not scratch the skin under the cast with an object. If itching is a problem, use a blow dryer on a cool setting over the itchy area.  Do not trim or cut your cast.  Do not take out the padding from inside your cast.  Exercise your joints near the cast as told by your doctor.  Raise (elevate) your injured limb on 1 or 2 pillows for the first 1 to 3 days. GET HELP IF:  Your cast or splint cracks.  Your cast or splint is too tight or too loose.  You itch badly under the cast.  Your cast gets wet or has a soft spot.  You have a  bad smell coming from the cast.  You get an object stuck under the cast.  Your skin around the cast becomes red or sore.  You have new or more pain after the cast is put on. GET HELP RIGHT AWAY IF:  You have fluid leaking through the cast.  You cannot move your fingers or toes.  Your fingers or toes turn blue or white or are cool, painful, or puffy (swollen).  You have tingling or lose feeling (numbness) around the injured area.  You have bad pain or pressure under the cast.  You have trouble breathing or have shortness of breath.  You have chest pain. Document Released: 03/22/2011 Document Revised: 07/23/2013 Document Reviewed: 05/29/2013 [facility] Patient Information 2015 [place], [place]. This information is not intended to replace advice given to you by your health care provider. Make sure you discuss any questions you have with your health care provider.

## 2015-06-14 NOTE — ED Notes (Signed)
Pt c/o L wrist injury after a stumble and fall this morning.  Pain score 8/10.  Limited ROM noted.  Denies numbness and tingling, but sts "it just feels weird."  Swelling noted.

## 2015-06-14 NOTE — ED Provider Notes (Signed)
CSN: [id number]     Arrival date & time 06/14/15  1117 History   First MD Initiated Contact with Patient 06/14/15 1129     No chief complaint on file.    (Consider location/radiation/quality/duration/timing/severity/associated sxs/prior Treatment) HPI Comments: Pt states that she was working outside and she fell. No loc with fall. Pt states that she is having pain in her left wrist. Denies previous injury. Pt has placed ice to the area. Denies numbness to the area.  The history is provided by the patient. No language interpreter was used.    Past Medical History  Diagnosis Date  . Hx of malignant melanoma 02/28/2013    2003 followed by derm.    . Palpitations   . Hx of breast biopsy 2000  . Anemia   . Cervical polyp 2007  . Vitamin D deficiency   . Osteopenia     mild   Past Surgical History  Procedure Laterality Date  . Tonsillectomy  1960  . Breast lumpectomy  1984    left breast   Family History  Problem Relation Age of Onset  . Hypertension Paternal Uncle   . Lung cancer    . Stroke    . Other      mom viral meningitis  . Osteoarthritis    . Alcoholism    . Breast cancer Paternal Aunt   . Breast cancer Other     paternal great aunt   History  Substance Use Topics  . Smoking status: Never Smoker   . Smokeless tobacco: Never Used  . Alcohol Use: 0.0 - 1.0 oz/week    0-2 drink(s) per week     Comment: occ   OB History    Gravida Para Term Preterm AB TAB SAB Ectopic Multiple Living   0 0 0 0 0 0 0 0 0 0      Review of Systems  All other systems reviewed and are negative.     Allergies  Review of patient's allergies indicates no known allergies.  Home Medications   Prior to Admission medications   Medication Sig Start Date End Date Taking? Authorizing Provider  acetaZOLAMIDE (DIAMOX) 250 MG tablet Take 1 tablet (250 mg total) by mouth 2 (two) times daily. as needed for altitude sickness 03/11/15   Burnis Medin, MD  amoxicillin-clavulanate  (AUGMENTIN) 875-125 MG per tablet Take 1 tablet by mouth every 12 (twelve) hours. 02/08/15   Burnis Medin, MD  atovaquone-proguanil (MALARONE) 250-100 MG TABS Take 1 tablet by mouth daily. 1 to 2 days prior to entering endemic area, during stay, and for 7 days after return [2] 04/04/15   Burnis Medin, MD  azithromycin (ZITHROMAX) 500 MG tablet Take 2 tablets (1,000 mg total) by mouth daily. For traveler diarrhea 04/04/15   Burnis Medin, MD  B Complex Vitamins (VITAMIN B COMPLEX PO) Take by mouth daily.    Historical Provider, MD  Calcium Carbonate Antacid (TUMS PO) Take by mouth as needed.    Historical Provider, MD  Calcium-Magnesium-Vitamin D (CALCIUM 500 PO)     Historical Provider, MD  ciprofloxacin (CIPRO) 500 MG tablet Take 1 tablet (500 mg total) by mouth 2 (two) times daily. If needed for travelers diarrhea 03/11/15   Burnis Medin, MD  doxycycline (VIBRA-TABS) 100 MG tablet Take 1 po qd 2 days pre travel and continue 4 weeks after.for malaria prophylaxis 03/11/15   Burnis Medin, MD  ergocalciferol (VITAMIN D2) 50000 UNITS capsule Take 1 capsule (50,000 Units total)  by mouth once a week. 12/08/14   Megan Salon, MD  Multiple Vitamins-Minerals (MULTIVITAMIN PO) Take by mouth daily.    Historical Provider, MD  Omega-3 Fatty Acids (FISH OIL PO) Take by mouth.    Historical Provider, MD   LMP 03/05/2007 Physical Exam  Constitutional: She is oriented to person, place, and time. She appears well-developed and well-nourished.  Pulmonary/Chest: Effort normal and breath sounds normal.  Musculoskeletal:  Obvious swelling noted to the left wrist. No gross deformity noted. Pt unable to pronate and supinate. Pulses intact  Neurological: She is oriented to person, place, and time. She exhibits normal muscle tone.  Skin: Skin is warm and dry.  Nursing note and vitals reviewed.   ED Course  Procedures (including critical care time) Labs Review Labs Reviewed - No data to display  Imaging Review Dg  Wrist Complete Left  06/14/2015   CLINICAL DATA:  Fall on outstretched hand.  Initial encounter.  EXAM: LEFT WRIST - COMPLETE 3+ VIEW  COMPARISON:  None.  FINDINGS: Comminuted distal radius fracture is present. There shortening of the radius associated with impaction of the fracture. Loss of the normal volar tilt. Moderate displacement of the distal radius fragments. Greatest amount of displacement is fragment adjacent to the distal radioulnar joint measuring about 3 mm or 4 mm.  The fracture extends into the distal radioulnar joint and ulnar positive variance is present associated with shortening of the radius. The ulnar styloid fracture is minimally displaced. The scaphoid bone appears intact. Mild STT joint osteoarthritis. Dorsal subluxation of the ulna is evident on the lateral view.  IMPRESSION: 1. Comminuted moderately displaced intra-articular predominantly transverse distal radius fracture with shortening of the distal radius and ulnar positive variance. 2. Mildly displaced ulnar styloid fracture and dorsal subluxation of the distal ulna relative to the fractured distal radius.   Electronically Signed   By: Dereck Ligas M.D.   On: 06/14/2015 12:15     EKG Interpretation None      MDM   Final diagnoses:  Radius and ulna distal fracture, left, closed, initial encounter    Spoke with Robert Dr. Levell July pa and pt is to follow up in the office tomorrow after splinting today as pt is neurovascularly intact. Will send home with percocet for pain    Glendell Docker, NP 06/14/15 [place], MD 06/18/15 1331

## 2015-06-14 NOTE — ED Notes (Signed)
Verbalized understanding discharge instructions and follow-up. In no acute distress.  Pt reports that a friend will be picking her up.

## 2015-06-14 NOTE — ED Notes (Signed)
Gave Patient a warm blanket

## 2015-06-17 ENCOUNTER — Encounter (HOSPITAL_COMMUNITY): Payer: Self-pay | Admitting: *Deleted

## 2015-06-17 ENCOUNTER — Other Ambulatory Visit: Payer: Self-pay | Admitting: Orthopedic Surgery

## 2015-06-17 NOTE — Progress Notes (Signed)
Pt denies SOB, chest pain, and being under the care of a cardiologist. Pt denies having a stress test, echo and cardiac cath. Pt denies having an EKG and chest x ray within the last year. Pt made aware to stop taking  Aspirin, otc vitamins and herbal medications ( fish oil). Do not take any NSAIDs ie: Ibuprofen, Advil, Naproxen or any medication containing Aspirin. Pt verbalized understanding of all pre-op instructions.

## 2015-06-18 ENCOUNTER — Encounter (HOSPITAL_COMMUNITY): Payer: Self-pay | Admitting: *Deleted

## 2015-06-19 ENCOUNTER — Ambulatory Visit (HOSPITAL_COMMUNITY): Payer: BLUE CROSS/BLUE SHIELD | Admitting: Anesthesiology

## 2015-06-19 ENCOUNTER — Observation Stay (HOSPITAL_COMMUNITY)
Admission: RE | Admit: 2015-06-19 | Discharge: 2015-06-20 | Disposition: A | Discharge status: Home / Self Care | Payer: BLUE CROSS/BLUE SHIELD | Source: Ambulatory Visit | Attending: Orthopedic Surgery | Admitting: Orthopedic Surgery

## 2015-06-19 ENCOUNTER — Encounter (HOSPITAL_COMMUNITY): Payer: Self-pay | Admitting: *Deleted

## 2015-06-19 ENCOUNTER — Encounter (HOSPITAL_COMMUNITY)
Admission: RE | Disposition: A | Discharge status: Home / Self Care | Payer: Self-pay | Source: Ambulatory Visit | Attending: Orthopedic Surgery

## 2015-06-19 DIAGNOSIS — W19XXXA Unspecified fall, initial encounter: Secondary | ICD-10-CM | POA: Diagnosis not present

## 2015-06-19 DIAGNOSIS — Z85820 Personal history of malignant melanoma of skin: Secondary | ICD-10-CM | POA: Insufficient documentation

## 2015-06-19 DIAGNOSIS — S5290XA Unspecified fracture of unspecified forearm, initial encounter for closed fracture: Secondary | ICD-10-CM | POA: Diagnosis present

## 2015-06-19 DIAGNOSIS — S52612A Displaced fracture of left ulna styloid process, initial encounter for closed fracture: Secondary | ICD-10-CM | POA: Diagnosis not present

## 2015-06-19 DIAGNOSIS — S52572A Other intraarticular fracture of lower end of left radius, initial encounter for closed fracture: Principal | ICD-10-CM | POA: Insufficient documentation

## 2015-06-19 HISTORY — DX: Headache, unspecified: R51.9

## 2015-06-19 HISTORY — DX: Headache: R51

## 2015-06-19 HISTORY — PX: OPEN REDUCTION INTERNAL FIXATION (ORIF) WRIST WITH RADIAL BONE GRAFT: SHX5670

## 2015-06-19 HISTORY — DX: Cardiac arrhythmia, unspecified: I49.9

## 2015-06-19 LAB — SURGICAL PCR SCREEN
MRSA, PCR: NEGATIVE
Staphylococcus aureus: NEGATIVE

## 2015-06-19 LAB — CBC
HCT: 38 % (ref 36.0–46.0)
HEMOGLOBIN: 13.1 g/dL (ref 12.0–15.0)
MCH: 29.6 pg (ref 26.0–34.0)
MCHC: 34.5 g/dL (ref 30.0–36.0)
MCV: 86 fL (ref 78.0–100.0)
PLATELETS: 196 10*3/uL (ref 150–400)
RBC: 4.42 MIL/uL (ref 3.87–5.11)
RDW: 12.3 % (ref 11.5–15.5)
WBC: 4.4 10*3/uL (ref 4.0–10.5)

## 2015-06-19 SURGERY — OPEN REDUCTION INTERNAL FIXATION (ORIF) WRIST WITH RADIAL BONE GRAFT
Anesthesia: Regional | Site: Wrist | Laterality: Left

## 2015-06-19 MED ORDER — LACTATED RINGERS IV SOLN
INTRAVENOUS | Status: DC
  Administered 2015-06-19 (×2): via INTRAVENOUS

## 2015-06-19 MED ORDER — TAPENTADOL HCL 50 MG PO TABS
100.0000 mg | ORAL_TABLET | ORAL | Status: DC | PRN
  Administered 2015-06-19 – 2015-06-20 (×3): 100 mg via ORAL
  Filled 2015-06-19 (×3): qty 2

## 2015-06-19 MED ORDER — FAMOTIDINE 20 MG PO TABS
20.0000 mg | ORAL_TABLET | Freq: Two times a day (BID) | ORAL | Status: DC | PRN
Start: 2015-06-19 — End: 2015-06-20

## 2015-06-19 MED ORDER — MUPIROCIN 2 % EX OINT
1.0000 "application " | TOPICAL_OINTMENT | Freq: Once | CUTANEOUS | Status: AC
  Administered 2015-06-19: 1 via TOPICAL

## 2015-06-19 MED ORDER — PROPOFOL 10 MG/ML IV BOLUS
INTRAVENOUS | Status: AC
  Filled 2015-06-19: qty 20

## 2015-06-19 MED ORDER — CEFAZOLIN SODIUM 1-5 GM-% IV SOLN
INTRAVENOUS | Status: AC
  Filled 2015-06-19: qty 50

## 2015-06-19 MED ORDER — METHOCARBAMOL 500 MG PO TABS
500.0000 mg | ORAL_TABLET | Freq: Four times a day (QID) | ORAL | Status: DC | PRN
  Administered 2015-06-19: 500 mg via ORAL
  Filled 2015-06-19 (×3): qty 1

## 2015-06-19 MED ORDER — MORPHINE SULFATE 2 MG/ML IJ SOLN
1.0000 mg | INTRAMUSCULAR | Status: DC | PRN
  Administered 2015-06-19: 1 mg via INTRAVENOUS
  Filled 2015-06-19: qty 1

## 2015-06-19 MED ORDER — MUPIROCIN 2 % EX OINT
TOPICAL_OINTMENT | CUTANEOUS | Status: AC
  Administered 2015-06-19: 1 via TOPICAL
  Filled 2015-06-19: qty 22

## 2015-06-19 MED ORDER — ONDANSETRON HCL 4 MG/2ML IJ SOLN
INTRAMUSCULAR | Status: DC | PRN
  Administered 2015-06-19: 4 mg via INTRAVENOUS

## 2015-06-19 MED ORDER — BUPIVACAINE HCL (PF) 0.25 % IJ SOLN
INTRAMUSCULAR | Status: AC
  Filled 2015-06-19: qty 30

## 2015-06-19 MED ORDER — EPHEDRINE SULFATE 50 MG/ML IJ SOLN
INTRAMUSCULAR | Status: DC | PRN
  Administered 2015-06-19 (×2): 5 mg via INTRAVENOUS

## 2015-06-19 MED ORDER — SODIUM CHLORIDE 0.45 % IV SOLN: INTRAVENOUS | Status: DC

## 2015-06-19 MED ORDER — CEFAZOLIN SODIUM-DEXTROSE 2-3 GM-% IV SOLR
2.0000 g | INTRAVENOUS | Status: AC
  Administered 2015-06-19: 2 g via INTRAVENOUS
  Filled 2015-06-19: qty 50

## 2015-06-19 MED ORDER — FENTANYL CITRATE (PF) 100 MCG/2ML IJ SOLN
INTRAMUSCULAR | Status: DC | PRN
  Administered 2015-06-19 (×4): 50 ug via INTRAVENOUS

## 2015-06-19 MED ORDER — 0.9 % SODIUM CHLORIDE (POUR BTL) OPTIME
TOPICAL | Status: DC | PRN
  Administered 2015-06-19: 1000 mL

## 2015-06-19 MED ORDER — METHOCARBAMOL 1000 MG/10ML IJ SOLN
500.0000 mg | Freq: Four times a day (QID) | INTRAVENOUS | Status: DC | PRN
  Filled 2015-06-19: qty 5

## 2015-06-19 MED ORDER — PROPOFOL 10 MG/ML IV BOLUS
INTRAVENOUS | Status: DC | PRN
  Administered 2015-06-19: 200 mg via INTRAVENOUS

## 2015-06-19 MED ORDER — LIDOCAINE HCL (CARDIAC) 20 MG/ML IV SOLN
INTRAVENOUS | Status: DC | PRN
  Administered 2015-06-19: 80 mg via INTRAVENOUS

## 2015-06-19 MED ORDER — ALPRAZOLAM 0.5 MG PO TABS: 0.5000 mg | ORAL_TABLET | Freq: Four times a day (QID) | ORAL | Status: DC | PRN

## 2015-06-19 MED ORDER — FENTANYL CITRATE (PF) 250 MCG/5ML IJ SOLN
INTRAMUSCULAR | Status: AC
  Filled 2015-06-19: qty 5

## 2015-06-19 MED ORDER — LACTATED RINGERS IV SOLN
INTRAVENOUS | Status: DC
  Administered 2015-06-19: 12:00:00 via INTRAVENOUS

## 2015-06-19 MED ORDER — PHENYLEPHRINE HCL 10 MG/ML IJ SOLN
INTRAMUSCULAR | Status: DC | PRN
  Administered 2015-06-19 (×2): 80 ug via INTRAVENOUS
  Administered 2015-06-19 (×3): 120 ug via INTRAVENOUS
  Administered 2015-06-19: 80 ug via INTRAVENOUS
  Administered 2015-06-19: 120 ug via INTRAVENOUS
  Administered 2015-06-19 (×2): 80 ug via INTRAVENOUS
  Administered 2015-06-19: 120 ug via INTRAVENOUS
  Administered 2015-06-19: 160 ug via INTRAVENOUS
  Administered 2015-06-19 (×3): 80 ug via INTRAVENOUS
  Administered 2015-06-19: 120 ug via INTRAVENOUS

## 2015-06-19 MED ORDER — CEFAZOLIN SODIUM 1-5 GM-% IV SOLN
1.0000 g | INTRAVENOUS | Status: AC
  Administered 2015-06-19: 1 g via INTRAVENOUS
  Filled 2015-06-19: qty 50

## 2015-06-19 MED ORDER — ONDANSETRON HCL 4 MG/2ML IJ SOLN: 4.0000 mg | Freq: Once | INTRAMUSCULAR | Status: DC | PRN

## 2015-06-19 MED ORDER — CEFAZOLIN SODIUM 1-5 GM-% IV SOLN
1.0000 g | Freq: Three times a day (TID) | INTRAVENOUS | Status: DC
  Administered 2015-06-19 – 2015-06-20 (×2): 1 g via INTRAVENOUS
  Filled 2015-06-19 (×6): qty 50

## 2015-06-19 MED ORDER — CHLORHEXIDINE GLUCONATE 4 % EX LIQD: 60.0000 mL | Freq: Once | CUTANEOUS | Status: DC

## 2015-06-19 MED ORDER — DOCUSATE SODIUM 100 MG PO CAPS
100.0000 mg | ORAL_CAPSULE | Freq: Two times a day (BID) | ORAL | Status: DC
  Administered 2015-06-19 – 2015-06-20 (×2): 100 mg via ORAL
  Filled 2015-06-19 (×2): qty 1

## 2015-06-19 MED ORDER — VITAMIN C 500 MG PO TABS
1000.0000 mg | ORAL_TABLET | Freq: Every day | ORAL | Status: DC
  Administered 2015-06-19 – 2015-06-20 (×2): 1000 mg via ORAL
  Filled 2015-06-19 (×2): qty 2

## 2015-06-19 SURGICAL SUPPLY — 73 items
BANDAGE ELASTIC 3 VELCRO ST LF (GAUZE/BANDAGES/DRESSINGS) ×2 IMPLANT
BANDAGE ELASTIC 4 VELCRO ST LF (GAUZE/BANDAGES/DRESSINGS) ×2 IMPLANT
BIT DRILL 2.0 LNG QUCK RELEASE (BIT) ×1 IMPLANT
BIT DRILL 2.8 QUICK RELEASE (BIT) ×1 IMPLANT
BIT DRILL MICR ACTRK 2 LNG PRF (BIT) ×1 IMPLANT
BLADE SURG ROTATE 9660 (MISCELLANEOUS) IMPLANT
BNDG ESMARK 4X9 LF (GAUZE/BANDAGES/DRESSINGS) ×2 IMPLANT
BNDG GAUZE ELAST 4 BULKY (GAUZE/BANDAGES/DRESSINGS) ×2 IMPLANT
CORDS BIPOLAR (ELECTRODE) ×2 IMPLANT
COVER SURGICAL LIGHT HANDLE (MISCELLANEOUS) ×2 IMPLANT
CUFF TOURNIQUET SINGLE 18IN (TOURNIQUET CUFF) ×2 IMPLANT
CUFF TOURNIQUET SINGLE 24IN (TOURNIQUET CUFF) IMPLANT
DRAIN TLS ROUND 10FR (DRAIN) IMPLANT
DRAPE OEC MINIVIEW 54X84 (DRAPES) IMPLANT
DRAPE SURG 17X23 STRL (DRAPES) ×2 IMPLANT
DRILL 2.0 LNG QUICK RELEASE (BIT) ×2
DRILL 2.8 QUICK RELEASE (BIT) ×2
DRILL MICRO ACUTRAK 2 LNG PROF (BIT) ×2
DRSG ADAPTIC 3X8 NADH LF (GAUZE/BANDAGES/DRESSINGS) ×2 IMPLANT
GAUZE SPONGE 4X4 12PLY STRL (GAUZE/BANDAGES/DRESSINGS) ×2 IMPLANT
GAUZE XEROFORM 1X8 LF (GAUZE/BANDAGES/DRESSINGS) ×4 IMPLANT
GLOVE BIOGEL M STRL SZ7.5 (GLOVE) ×2 IMPLANT
GLOVE SS BIOGEL STRL SZ 8 (GLOVE) ×1 IMPLANT
GLOVE SUPERSENSE BIOGEL SZ 8 (GLOVE) ×1
GOWN STRL REUS W/ TWL LRG LVL3 (GOWN DISPOSABLE) ×3 IMPLANT
GOWN STRL REUS W/ TWL XL LVL3 (GOWN DISPOSABLE) ×3 IMPLANT
GOWN STRL REUS W/TWL LRG LVL3 (GOWN DISPOSABLE) ×3
GOWN STRL REUS W/TWL XL LVL3 (GOWN DISPOSABLE) ×3
GUIDEWIRE ORTH 6X062XTROC NS (WIRE) ×1 IMPLANT
GUIDEWIRE ORTHO MICROSHT  ACUT (WIRE) ×3
GUIDEWIRE ORTHO MICROSHT .035 (WIRE) ×3 IMPLANT
K-WIRE .062 (WIRE) ×1
KIT BASIN OR (CUSTOM PROCEDURE TRAY) ×2 IMPLANT
KIT ROOM TURNOVER OR (KITS) ×2 IMPLANT
LOOP VESSEL MAXI BLUE (MISCELLANEOUS) IMPLANT
MANIFOLD NEPTUNE II (INSTRUMENTS) ×2 IMPLANT
NEEDLE 22X1 1/2 (OR ONLY) (NEEDLE) IMPLANT
NS IRRIG 1000ML POUR BTL (IV SOLUTION) ×2 IMPLANT
PACK ORTHO EXTREMITY (CUSTOM PROCEDURE TRAY) ×2 IMPLANT
PAD ARMBOARD 7.5X6 YLW CONV (MISCELLANEOUS) ×4 IMPLANT
PAD CAST 3X4 CTTN HI CHSV (CAST SUPPLIES) ×1 IMPLANT
PAD CAST 4YDX4 CTTN HI CHSV (CAST SUPPLIES) ×1 IMPLANT
PADDING CAST ABS 3INX4YD NS (CAST SUPPLIES) ×1
PADDING CAST ABS 4INX4YD NS (CAST SUPPLIES) ×1
PADDING CAST ABS COTTON 3X4 (CAST SUPPLIES) ×1 IMPLANT
PADDING CAST ABS COTTON 4X4 ST (CAST SUPPLIES) ×1 IMPLANT
PADDING CAST COTTON 3X4 STRL (CAST SUPPLIES) ×1
PADDING CAST COTTON 4X4 STRL (CAST SUPPLIES) ×1
PLATE LEFT DIST RADIUS NARROW (Plate) ×2 IMPLANT
PUTTY DBM STAGRAFT PLUS 5CC (Putty) ×2 IMPLANT
SCREW ACUTRAK 2 BONE STD 30MM (Screw) ×2 IMPLANT
SCREW CORT FT 20X2.3XLCK HEX (Screw) ×1 IMPLANT
SCREW CORTICAL LOCKING 2.3X14M (Screw) ×2 IMPLANT
SCREW CORTICAL LOCKING 2.3X20M (Screw) ×2 IMPLANT
SCREW CORTICAL LOCKING 2.3X22M (Screw) ×3 IMPLANT
SCREW FX20X2.3XSMTH LCK NS CRT (Screw) ×1 IMPLANT
SCREW FX22X2.3XLCK SMTH NS CRT (Screw) ×3 IMPLANT
SCREW HEXALOBE NON-LOCK 3.5X14 (Screw) ×2 IMPLANT
SCREW NONLOCK HEX 3.5X12 (Screw) ×4 IMPLANT
SPLINT FIBERGLASS 3X35 (CAST SUPPLIES) ×2 IMPLANT
SPONGE LAP 4X18 X RAY DECT (DISPOSABLE) IMPLANT
SUT MNCRL AB 4-0 PS2 18 (SUTURE) ×2 IMPLANT
SUT PROLENE 3 0 PS 2 (SUTURE) IMPLANT
SUT PROLENE 4 0 P 3 18 (SUTURE) ×6 IMPLANT
SUT VIC AB 3-0 FS2 27 (SUTURE) ×4 IMPLANT
SYR CONTROL 10ML LL (SYRINGE) IMPLANT
SYSTEM CHEST DRAIN TLS 7FR (DRAIN) ×2 IMPLANT
TOWEL OR 17X24 6PK STRL BLUE (TOWEL DISPOSABLE) ×2 IMPLANT
TOWEL OR 17X26 10 PK STRL BLUE (TOWEL DISPOSABLE) ×2 IMPLANT
TRAP DIGIT (INSTRUMENTS) ×2 IMPLANT
TUBE CONNECTING 12X1/4 (SUCTIONS) ×2 IMPLANT
TUBE EVACUATION TLS (MISCELLANEOUS) ×2 IMPLANT
WATER STERILE IRR 1000ML POUR (IV SOLUTION) ×2 IMPLANT

## 2015-06-19 NOTE — Anesthesia Procedure Notes (Addendum)
Anesthesia Regional Block:  Infraclavicular brachial plexus block  Pre-Anesthetic Checklist: ,, timeout performed, Correct Patient, Correct Site, Correct Laterality, Correct Procedure, Correct Position, site marked, Risks and benefits discussed,  Surgical consent,  Pre-op evaluation,  At surgeon's request and post-op pain management  Laterality: Left  Prep: Maximum Sterile Barrier Precautions used, chloraprep and alcohol swabs       Needles:  Injection technique: Single-shot  Needle Type: Stimulator Needle - 80          Additional Needles:  Procedures: Doppler guided and nerve stimulator Infraclavicular brachial plexus block  Nerve Stimulator or Paresthesia:  Response: 0.5 mA, 0.1 ms, 4 cm  Additional Responses:   Narrative:  Start time: 06/19/2015 7:15 AM End time: 06/19/2015 7:20 AM Injection made incrementally with aspirations every 5 mL.  Performed by: Personally  Anesthesiologist: Kate Sable  Additional Notes: Pt accepts procedure w/ risks. 16cc 0.5% Marcaine w/ epi w/o difficulty or discomfort. GES   Procedure Name: LMA Insertion Date/Time: 06/19/2015 8:00 AM Performed by: Shirlyn Goltz Pre-anesthesia Checklist: Patient identified, Emergency Drugs available, Suction available and Patient being monitored Patient Re-evaluated:Patient Re-evaluated prior to inductionOxygen Delivery Method: Circle system utilized Preoxygenation: Pre-oxygenation with 100% oxygen Intubation Type: IV induction Ventilation: Mask ventilation without difficulty LMA: LMA inserted LMA Size: 4.0 Number of attempts: 1 Placement Confirmation: positive ETCO2 and breath sounds checked- equal and bilateral Tube secured with: Tape Dental Injury: Teeth and Oropharynx as per pre-operative assessment

## 2015-06-19 NOTE — Transfer of Care (Signed)
Immediate Anesthesia Transfer of Care Note  Patient: Natalie [name]  Procedure(s) Performed: Procedure(s): OPEN REDUCTION INTERNAL FIXATION (ORIF) LEFT WRIST WITH RADIUS AND ULNA WITH ALLOGRAFT BONE GRAFT (Left)  Patient Location: PACU  Anesthesia Type:General and Regional  Level of Consciousness: awake, alert , oriented and patient cooperative  Airway & Oxygen Therapy: Patient Spontanous Breathing and Patient connected to nasal cannula oxygen  Post-op Assessment: Report given to RN, Post -op Vital signs reviewed and stable, Patient moving all extremities and Patient moving all extremities X 4  Post vital signs: Reviewed and stable  Last Vitals:  Filed Vitals:   06/19/15 0639  BP: 128/83  Pulse: 64  Temp: 36.9 C  Resp: 18    Complications: No apparent anesthesia complications

## 2015-06-19 NOTE — Evaluation (Signed)
Occupational Therapy Evaluation Patient Details Name: Natalie [name] MRN: [id number] DOB: 05/01/1956 Today's Date: 06/19/2015    History of Present Illness 59 y.o. s/p ORIF OPEN REDUCTION INTERNAL FIXATION (ORIF) LEFT WRIST WITH RADIUS AND ULNA WITH ALLOGRAFT BONE GRAFT   Clinical Impression   Pt s/p above. Education provided in session and pt moving well. Do not feel pt needs further OT. Will sign off.   Follow Up Recommendations  No OT follow up;Supervision - Intermittent    Equipment Recommendations  None recommended by OT    Recommendations for Other Services       Precautions / Restrictions Precautions Precautions: Other (comment) Precaution Comments: no pushing, pulling, lifting with LUE (can use to hold light items) Required Braces or Orthoses: Sling (for comfort) Restrictions Weight Bearing Restrictions: Yes LUE Weight Bearing: Non weight bearing      Mobility Bed Mobility Overal bed mobility: Supervision for IV                 Transfers Overall transfer level: Needs assistance   Transfers: Sit to/from Stand Sit to Stand: Supervision;Independent         General transfer comment: supervision as pt went to sit in chair and almost sat on IV.    Balance  No LOB in session.                                           ADL Overall ADL's : Needs assistance/impaired     Grooming: Wash/dry hands;Set up;Standing           Upper Body Dressing : Minimal assistance;Sitting   Lower Body Dressing: Sit to/from stand;Minimal assistance   Toilet Transfer: Supervision/safety;Ambulation;Comfort height toilet   Toileting- Clothing Manipulation and Hygiene: Sit to/from stand;Minimal assistance (managed clothing in session without difficulty, however feel she may need assist with fasteners)       Functional mobility during ADLs: Supervision/safety General ADL Comments: Reviewed UB dressing technique. Discussed managing fastener on bra and  if it is an issue after numbness in her left fingers wear off, she could wear sports bra or shirt with built in bra. Discussed shirts that may be easier to manage. Assist with sling today as her left arm is still numb.  Recommended someone be with pt for shower transfer and for showering. Suggested plastic wrap for LUE. Recommended sitting for most of LB ADLs. Educated on edema management techniques.      Vision     Perception     Praxis      Pertinent Vitals/Pain Pain Assessment: No/denies pain (no pain, just numbness and uncomfortable in LUE)     Hand Dominance Right   Extremity/Trunk Assessment Upper Extremity Assessment Upper Extremity Assessment: LUE deficits/detail LUE: Unable to fully assess due to immobilization LUE Sensation: decreased light touch LUE Coordination: decreased fine motor   Lower Extremity Assessment Lower Extremity Assessment: Overall WFL for tasks assessed       Communication Communication Communication: No difficulties   Cognition Arousal/Alertness: Awake/alert Behavior During Therapy: WFL for tasks assessed/performed Overall Cognitive Status: Within Functional Limits for tasks assessed                     General Comments       Exercises Exercises: Other exercises Other Exercises Other Exercises: demonstrated/educated on retrograde massage. Other Exercises: Pt moved left digits and explained she can use  right hand to assist gently.   Shoulder Instructions      Home Living Family/patient expects to be discharged to:: Private residence Living Arrangements: Spouse/significant other Available Help at Discharge: Family (could arrrange 24/7) Type of Home: House       Home Layout: Two level;Able to live on main level with bedroom/bathroom     Bathroom Shower/Tub: Tub/shower unit;Walk-in shower   Bathroom Toilet: Standard     Home Equipment: Bedside commode          Prior Functioning/Environment Level of Independence:  Independent        Comments: prior to injury     OT Diagnosis: Acute pain   OT Problem List:     OT Treatment/Interventions:      OT Goals(Current goals can be found in the care plan section)    OT Frequency:     Barriers to D/C:            Co-evaluation              End of Session Equipment Utilized During Treatment: Gait belt Nurse Communication: Other (comment) (verified it was okay for pt to walk with family)  Activity Tolerance: Patient tolerated treatment well Patient left: in chair;with call bell/phone within reach;with family/visitor present   Time: 1357-1419 OT Time Calculation (min): 22 min Charges:  OT General Charges $OT Visit: 1 Procedure OT Evaluation $Initial OT Evaluation Tier I: 1 Procedure G-Codes: OT G-codes **NOT FOR INPATIENT CLASS** Functional Assessment Tool Used: clinical judgment Functional Limitation: Self care Self Care Current Status (X4585): At least 20 percent but less than 40 percent impaired, limited or restricted Self Care Goal Status (F2924): At least 20 percent but less than 40 percent impaired, limited or restricted Self Care Discharge Status [phone]): At least 20 percent but less than 40 percent impaired, limited or restricted  Benito Mccreedy OTR/L [phone] 06/19/2015, 2:39 PM

## 2015-06-19 NOTE — Anesthesia Preprocedure Evaluation (Signed)
Anesthesia Evaluation  Patient identified by MRN, date of birth, ID band Patient awake    Reviewed: Allergy & Precautions, NPO status , Patient's Chart, lab work & pertinent test results  Airway Mallampati: I       Dental   Pulmonary    Pulmonary exam normal       Cardiovascular Normal cardiovascular exam+ dysrhythmias     Neuro/Psych  Headaches,  Neuromuscular disease    GI/Hepatic   Endo/Other    Renal/GU      Musculoskeletal   Abdominal   Peds  Hematology  (+) anemia ,   Anesthesia Other Findings   Reproductive/Obstetrics                             Anesthesia Physical Anesthesia Plan  ASA: II  Anesthesia Plan: General   Post-op Pain Management:    Induction: Intravenous  Airway Management Planned: LMA  Additional Equipment:   Intra-op Plan:   Post-operative Plan: Extubation in OR  Informed Consent: I have reviewed the patients History and Physical, chart, labs and discussed the procedure including the risks, benefits and alternatives for the proposed anesthesia with the patient or authorized representative who has indicated his/her understanding and acceptance.     Plan Discussed with: CRNA, Anesthesiologist and Surgeon  Anesthesia Plan Comments:         Anesthesia Quick Evaluation

## 2015-06-19 NOTE — Anesthesia Postprocedure Evaluation (Signed)
  Anesthesia Post-op Note  Patient: Natalie [name]  Procedure(s) Performed: Procedure(s): OPEN REDUCTION INTERNAL FIXATION (ORIF) LEFT WRIST WITH RADIUS AND ULNA WITH ALLOGRAFT BONE GRAFT (Left)  Patient Location: PACU  Anesthesia Type:General and GA combined with regional for post-op pain  Level of Consciousness: awake, alert , oriented and patient cooperative  Airway and Oxygen Therapy: Patient Spontanous Breathing  Post-op Pain: none  Post-op Assessment: Post-op Vital signs reviewed, Patient's Cardiovascular Status Stable, Respiratory Function Stable, No signs of Nausea or vomiting and Pain level controlled              Post-op Vital Signs: stable  Last Vitals:  Filed Vitals:   06/19/15 1107  BP: 113/68  Pulse: 62  Temp: 36.4 C  Resp: 14    Complications: No apparent anesthesia complications

## 2015-06-19 NOTE — H&P (Signed)
Natalie [name] is an 59 y.o. female.   Chief Complaint: Left wrist fracture HPI: Patient is a pleasant 59 year old female who presented to our office setting for evaluation of her left upper extremity after an injury she sustained. She fell resulting in a comminuted, complex comminuted displaced distal radius fracture with notable ulna styloid fracture. We discussed all issues with her given the displacement and comminution we discussed with her surgical intervention would certainly be recommended. The patient was in agreement. We went over all risk and benefits of surgery. Presents today for surgical intervention in the form of open reduction internal fixation left distal radius and possible ulna.  Past Medical History  Diagnosis Date  . Hx of malignant melanoma 02/28/2013    2003 followed by derm.    . Palpitations   . Hx of breast biopsy 2000  . Anemia   . Cervical polyp 2007  . Vitamin D deficiency   . Osteopenia     mild  . Dysrhythmia     PMH : PVC's as a child only; benign  . Headache     had only with intolerance to medication    Past Surgical History  Procedure Laterality Date  . Tonsillectomy  1960  . Breast lumpectomy  1984    left breast  . Colonoscopy    . Wisdom tooth extraction      Family History  Problem Relation Age of Onset  . Hypertension Paternal Uncle   . Lung cancer    . Stroke    . Other      mom viral meningitis  . Osteoarthritis    . Alcoholism    . Breast cancer Paternal Aunt   . Breast cancer Other     paternal great aunt   Social History:  reports that she has never smoked. She has never used smokeless tobacco. She reports that she drinks alcohol. She reports that she does not use illicit drugs.  Allergies:  Allergies  Allergen Reactions  . Oxycodone Other (See Comments)    Nausea and headache    Medications Prior to Admission  Medication Sig Dispense Refill  . calcium carbonate (TUMS EX) 750 MG chewable tablet Chew 2 tablets by mouth  daily.    . ergocalciferol (VITAMIN D2) 50000 UNITS capsule Take 1 capsule (50,000 Units total) by mouth once a week. (Patient taking differently: Take 50,000 Units by mouth once a week. Sundays) 12 capsule 4  . Multiple Vitamin (MULTIVITAMIN WITH MINERALS) TABS tablet Take 1 tablet by mouth daily.    . naproxen sodium (ANAPROX) 220 MG tablet Take 220-440 mg by mouth daily as needed (pain). ALEVE    . OVER THE COUNTER MEDICATION Place 1 drop into both eyes 2 (two) times daily as needed (dry eyes). Over the counter saline eye drops    . OVER THE COUNTER MEDICATION Take 1 tablet by mouth every 3 (three) hours as needed (cold symptoms (and 48 hours after cold symptoms subside)). Sambucol (OTC)    . polyethylene glycol (MIRALAX / GLYCOLAX) packet Take 17 g by mouth daily.    Marland Kitchen senna-docusate (PERI-COLACE) 8.6-50 MG per tablet Take 2-3 tablets by mouth 2 (two) times daily.    Marland Kitchen acetaZOLAMIDE (DIAMOX) 250 MG tablet Take 1 tablet (250 mg total) by mouth 2 (two) times daily. as needed for altitude sickness (Patient not taking: Reported on 06/18/2015) 24 tablet 0  . amoxicillin-clavulanate (AUGMENTIN) 875-125 MG per tablet Take 1 tablet by mouth every 12 (twelve) hours. (Patient not  taking: Reported on 06/18/2015) 14 tablet 0  . Ascorbic Acid (VITAMIN C) 1000 MG tablet Take 1,000 mg by mouth daily.    Marland Kitchen azithromycin (ZITHROMAX) 500 MG tablet Take 2 tablets (1,000 mg total) by mouth daily. For traveler diarrhea (Patient not taking: Reported on 06/18/2015) 2 tablet 0  . ciprofloxacin (CIPRO) 500 MG tablet Take 1 tablet (500 mg total) by mouth 2 (two) times daily. If needed for travelers diarrhea (Patient not taking: Reported on 06/18/2015) 6 tablet 0  . doxycycline (VIBRA-TABS) 100 MG tablet Take 1 po qd 2 days pre travel and continue 4 weeks after.for malaria prophylaxis (Patient not taking: Reported on 06/18/2015) 46 tablet 0  . tapentadol (NUCYNTA) 50 MG TABS tablet Take 50-100 mg by mouth every 4 (four) hours as  needed (pain).       No results found for this or any previous visit (from the past 48 hour(s)). No results found.  Review of Systems  Constitutional: Negative.   HENT: Negative.   Eyes: Negative.   Respiratory: Negative.   Cardiovascular: Negative.   Gastrointestinal: Negative.   Genitourinary: Negative.   Musculoskeletal:       See history of present illness  Skin: Negative.   Neurological: Negative.   Endo/Heme/Allergies: Negative.     Blood pressure 128/83, pulse 64, temperature 98.5 F (36.9 C), temperature source Oral, resp. rate 18, height 5\' 3"  (1.6 m), weight 74.844 kg (165 lb), last menstrual period 03/05/2007, SpO2 99 %. Physical Exam  Examination of left upper extremity shows her splint is clean and intact digital range of motion is intact sensation refill and intact no signs of dystrophy are present. .The patient is alert and oriented in no acute distress. The patient complains of pain in the affected upper extremity.  The patient is noted to have a normal HEENT exam. Lung fields show equal chest expansion and no shortness of breath. Abdomen exam is nontender without distention. Lower extremity examination does not show any fracture dislocation or blood clot symptoms. Pelvis is stable and the neck and back are stable and nontender. Assessment/Plan Comminuted displaced left intra-articular distal radius fracture with associated ulnar styloid fracture Patient Active Problem List   Diagnosis Date Noted  . Plantar wart of right foot 04/30/2013  . Greater trochanteric bursitis of right hip 04/30/2013  . Foot pain 02/28/2013  . Hx of malignant melanoma 02/28/2013  . Numbness and tingling in right hand 11/09/2011  . CORNS AND CALLUSES 06/08/2010  . URINALYSIS, ABNORMAL 06/08/2010  . KNEE PAIN, LEFT 08/17/2008  . PAIN IN JOINT OTHER SPECIFIED SITES 07/10/2008  . MYALGIA 07/10/2008  . FATIGUE 07/10/2008  .We are planning surgery for your upper extremity. The risk  and benefits of surgery to include risk of bleeding, infection, anesthesia,  damage to normal structures and failure of the surgery to accomplish its intended goals of relieving symptoms and restoring function have been discussed in detail. With this in mind we plan to proceed. I have specifically discussed with the patient the pre-and postoperative regime and the dos and don'ts and risk and benefits in great detail. Risk and benefits of surgery also include risk of dystrophy(CRPS), chronic nerve pain, failure of the healing process to go onto completion and other inherent risks of surgery The relavent the pathophysiology of the disease/injury process, as well as the alternatives for treatment and postoperative course of action has been discussed in great detail with the patient who desires to proceed.  We will do everything in our power  to help you (the patient) restore function to the upper extremity. It is a pleasure to see this patient today.   Elwin Tsou L 06/19/2015, 7:44 AM

## 2015-06-19 NOTE — Op Note (Signed)
See dictation #[id number] B[facility] Md

## 2015-06-19 NOTE — Op Note (Signed)
NAMEHALEI, HANOVER NO.:  [id number]  MEDICAL RECORD NO.:  [id number]  LOCATION:  [facility]                         FACILITY:  [facility]  PHYSICIAN:  Satira Anis. Cherrise Occhipinti, M.D.DATE OF BIRTH:  [birth year]  DATE OF PROCEDURE:  06/19/2015 DATE OF DISCHARGE:                              OPERATIVE REPORT   PREOPERATIVE DIAGNOSIS:  Comminuted complex left distal radius fracture, greater than 5-part intra-articular with associated ulnar styloid fracture.  POSTOPERATIVE DIAGNOSIS:  Comminuted complex left distal radius fracture, greater than 5-part intra-articular with associated ulnar styloid fracture.  PROCEDURE: 1. Open reduction and internal fixation with Acumed plate and screw     construct, left comminuted complex intra-articular distal radius     fracture greater than 5-part with associated allograft bone graft. 2. AP, lateral, and oblique stress x-ray performed, examined, and     interpreted by myself. 3. Open reduction and internal fixation, ulna styloid/distal ulna     fracture. 4. Sliding brachioradialis tenotomy, left wrist.  SURGEON:  Satira Anis. Amedeo Plenty, M.D.  ASSISTANT:  Avelina Laine, PA-C.  COMPLICATIONS:  None.  ANESTHESIA:  General, preoperative block.  TOURNIQUET TIME:  Less than an hour.  DRAINS:  One.  TOURNIQUET TIME:  Less than 2 hours.  INDICATIONS:  A 59 year old female presents with above-mentioned diagnosis.  I have counseled her in regard to risks and benefits of surgery including risk of infection, bleeding, anesthesia, damage to normal structures, and failure of surgery to accomplish its intended goals of relieving symptoms and restoring function.  With this in mind, she desires to proceed.  All questions have been encouraged and answered preoperatively.  OPERATIVE PROCEDURE:  The patient was seen by myself and Anesthesia. Taken to the operative theater and underwent a smooth induction of general anesthesia.  Preoperatively, block  was placed by Dr. Tamala Julian.  Once in the operative arena, she was pre-scrubbed with Hibiclens. Following this, a 10-minute surgical Betadine scrub and paint was applied.  Time-out was called.  Preoperative antibiotics were given without difficulty, and following this, the patient then underwent a very careful and cautious approach to the extremity with volar radial incision.  Dissection was carried down and following dissection being carried down through the skin, I then identified the FCR, it was split dorsally and palmarly.  Fasciotomy was accomplished.  Following this, the carpal canal contents were then retracted in an ulnar direction, and the patient then underwent a very careful and cautious approach to the wrist fracture.  The pronator was incised, although it was badly damaged.  At this time, I then performed a sliding brachioradialis tenotomy to lessen deforming forces.  Once this was complete, we then identified the fracture and reassembled it.  There was a large metaphyseal void and I placed stay graft, which was an allograft bone graft in the defect.  Following this, we then placed a plate and screw construct from Acumed and regained her radial height and volar tilt to my satisfaction.  Radial inclination was satisfactory as well though she had a little loss of radial inclination due to the comminution.  Plate and screw fixation was performed without difficulty.  Following this, we irrigated copiously and closed the pronator.  AP, lateral, and oblique x-rays were performed, examined, and interpreted by myself and looked to be excellent.  I was pleased with this and the findings.  Following this, I then demonstrated the instability about the distal radioulnar joint around the ulna styloid fracture, which was at the base, and thus I felt she would be certainly improved if we could have a better semblance of stability here.  Thus, we made a small incision approximately an inch,  dissected down about the ulnar aspect of the subcutaneous border of the ulna and then reduced the ulna styloid fracture and placed a 30 mm Acutrak screw.  The patient tolerated this well.  There were no complicating features.  Following placement of the screw, we then performed very careful and cautious look on the AP, lateral, and oblique x-rays, which looked excellent.  Thus, open reduction and internal fixation of a greater than 5-part comminuted intra-articular distal radius fracture and open reduction and internal fixation of the distal ulna/ulnar styloid was accomplished. The patient tolerated this well.  The Acutrak screw as mentioned was a micro Acutrak screw.  The patient tolerated this well.  The wound was then irrigated copiously.  I closed the extensor retinaculum about the subcutaneous border of the ulna with Vicryl and then the skin edge with Prolene.  The volar radial wound was closed with Prolene over TLS drain. She was placed in a sugar-tong splint.  I have discussed with her elevation, range of motion, massage, and other measures preoperatively, and we will continue this postoperatively.  She will be admitted for IV antibiotics, pain control, and other measures.  These notes have been discussed and all questions have been encouraged and answered.  Should any problems arise, we will be immediately available.  Going forward, we will place her in complete immobilization with sugar-tong cast for 4 weeks.  At 4 weeks, consider removable clamshell-type apparatus, at 6 weeks range of motion, at 8 to 10 weeks some gentle strengthening.  We will go a little bit slower given the ulnar styloid ORIF.  These notes have been discussed.  She was taken to the recovery room in a stable condition.  After extubation, all questions have been addressed with her preoperatively, and of course we will go over the findings and plans postoperatively as well.  It was a pleasure to see her and  treat her.  This is a very complex fracture, but one that was reassembled quite nicely I felt.     Satira Anis. Amedeo Plenty, M.D.     [facility]  D:  06/19/2015  T:  06/19/2015  Job:  [id number]

## 2015-06-20 DIAGNOSIS — S52572A Other intraarticular fracture of lower end of left radius, initial encounter for closed fracture: Secondary | ICD-10-CM | POA: Diagnosis not present

## 2015-06-20 NOTE — Discharge Instructions (Signed)
Please elevate move and massage her fingers as instructed by Dr. Amedeo Plenty  Please notify should and problems occur.  Please remember to move your shoulder every now and then to prevent stiffness  Keep bandage clean and dry.  Call for any problems.  No smoking.  Criteria for driving a car: you should be off your pain medicine for 7-8 hours, able to drive one handed(confident), thinking clearly and feeling able in your judgement to drive. Continue elevation as it will decrease swelling.  If instructed by MD move your fingers within the confines of the bandage/splint.  Use ice if instructed by your MD. Call immediately for any sudden loss of feeling in your hand/arm or change in functional abilities of the extremity.We recommend that you to take vitamin C 1000 mg a day to promote healing. We also recommend that if you require  pain medicine that you take a stool softener to prevent constipation as most pain medicines will have constipation side effects. We recommend either Peri-Colace or Senokot and recommend that you also consider adding MiraLAX to prevent the constipation affects from pain medicine if you are required to use them. These medicines are over the counter and maybe purchased at a local pharmacy. A cup of yogurt and a probiotic can also be helpful during the recovery process as the medicines can disrupt your intestinal environment.

## 2015-06-20 NOTE — Discharge Summary (Signed)
Patient has been seen and examined. Patient has pain appropriate to his injury/process. Patient denies new complaints at this present time. I have discussed the care pathway with nursing staff. Patient is appropriate and alert.  We reviewed vital signs and intake output which are stable.  The upper extremity is neurovascularly intact. Refill is normal. There is no signs of compartment syndrome. There is no signs of dystrophy. There is normal sensation.  I have spent a  great deal of time discussing range of motion edema control and other techniques to decrease edema and promote flexion extension of the fingers. Patient understands the importance of elevation range of motion massage and other measures to lessen pain and prevent swelling.  We have also discussed immobilization to appropriate areas involved.  We have discussed with the patient shoulder range of motion to prevent adhesive capsulitis.  The remainder of the examination is normal today without complicating feature.  Drain was removed without difficulty  Patient will be discharged home. Will plan to see the patient back in the office as per discharge instructions (please see discharge instructions).  Patient had an uneventful hospital course. At the time of discharge patient is stable awake alert and oriented in no acute distress. Regular diet will be continued and has been tolerated. Patient will notify should have problems occur. There is no signs of DVT infection or other complication at this juncture.  All questions have been incurred and answered.  Please see discharge med list  Final diagnosis status post distal radius and ulna ORIF left upper extremity  See med list for meds on DC  Regular diet.  RTC 12 days.  Notify me same problems occur.  Zairah Arista M.D.

## 2015-06-21 ENCOUNTER — Encounter (HOSPITAL_COMMUNITY): Payer: Self-pay | Admitting: Orthopedic Surgery

## 2015-06-28 ENCOUNTER — Encounter (HOSPITAL_COMMUNITY): Payer: Self-pay | Admitting: Orthopedic Surgery

## 2015-07-09 ENCOUNTER — Telehealth: Payer: Self-pay | Admitting: Obstetrics & Gynecology

## 2015-07-09 NOTE — Telephone Encounter (Signed)
Patient is not sure if she should get a 3D mammogram this year. Ok to leave a detailed message if she is not available.

## 2015-07-12 NOTE — Telephone Encounter (Signed)
Left detailed message at number provided [phone]. Advised patient that 3D screening mammograms are recommended however they are not required. Advised they are not guaranteed to be covered with insurance and she may want to check with [facility] to discuss costs. If she is able 3D screening is recommended if she is unable standard screening is okay. Advised to return call to office with any further questions.  Routing to provider for final review. Patient agreeable to disposition. Will close encounter.   Patient aware provider will review message and nurse will return call if any additional advice or change of disposition.

## 2015-07-28 LAB — HM MAMMOGRAPHY

## 2015-08-02 ENCOUNTER — Encounter: Payer: Self-pay | Admitting: Family Medicine

## 2015-09-24 ENCOUNTER — Telehealth: Payer: Self-pay | Admitting: Internal Medicine

## 2015-09-24 NOTE — Telephone Encounter (Signed)
lmovm to call and make an appt for that 2nd hep a

## 2015-09-24 NOTE — Telephone Encounter (Signed)
Pt now due for second hep a.  Please help her to make an appointment.  Thanks!!

## 2015-09-24 NOTE — Telephone Encounter (Signed)
Pt is asking when she should come back in for a Hep A 2nd shot

## 2015-12-17 ENCOUNTER — Other Ambulatory Visit: Payer: Self-pay | Admitting: Obstetrics & Gynecology

## 2015-12-20 NOTE — Telephone Encounter (Signed)
Medication refill request: Vit D Last AEX:  12/07/14 SM Next AEX: 02/18/16 SM Last MMG (if hormonal medication request): 07/28/15 BIRADS1:neg Refill authorized: 12/08/14 #12caps/4R. Today please advise.

## 2016-01-24 ENCOUNTER — Telehealth: Payer: Self-pay | Admitting: Obstetrics & Gynecology

## 2016-01-24 NOTE — Telephone Encounter (Signed)
With hx of arm fracture last year, I think she should have another BMD.  Thanks.

## 2016-01-24 NOTE — Telephone Encounter (Signed)
Spoke with patient. Advised of message as seen below from [place]. She is agreeable and will call [facility] to schedule. Order for BMD to [facility] for review and signature before faxing to [facility].

## 2016-01-24 NOTE — Telephone Encounter (Signed)
Spoke with patient. She states that she received a call from [facility] stating she needs to schedule her BMD at this time. "I think that [facility] told me I could wait a while longer, but I can not remember." Advised per aex note from [facility] on 12/07/2014 she will need to have her BMD done in 3-4 years. This would put her having her BMD in 2018-2019 as last BMD was done in 2015. Patient reports she fractured her arm in July of 2016. Advised I will check with Dr.Miller and return call with recommendations as recent fracture may change the time frame recommendation for her to have BMD. She is agreeable. She is also requesting recommendation for dermatologist in [place]. Provided information for Dr.Hope Tonia Brooms with [facility]. She will contact their office to schedule an appointment.  Dr.Miller, due to recent fracture would you like patient to have BMD performed earlier? Last BMD was in 2015.

## 2016-01-24 NOTE — Telephone Encounter (Signed)
Patient is calling with some questions for Dr.Miller's nurse. No further details given.

## 2016-01-25 ENCOUNTER — Ambulatory Visit (INDEPENDENT_AMBULATORY_CARE_PROVIDER_SITE_OTHER): Payer: BLUE CROSS/BLUE SHIELD | Admitting: Family Medicine

## 2016-01-25 DIAGNOSIS — Z23 Encounter for immunization: Secondary | ICD-10-CM | POA: Diagnosis not present

## 2016-02-01 NOTE — Telephone Encounter (Signed)
Order for BMD was faxed to [facility] with cover sheet and confirmation on 01/24/2016. Will close encounter.

## 2016-02-18 ENCOUNTER — Ambulatory Visit: Payer: BLUE CROSS/BLUE SHIELD | Admitting: Obstetrics & Gynecology

## 2016-02-28 ENCOUNTER — Ambulatory Visit (INDEPENDENT_AMBULATORY_CARE_PROVIDER_SITE_OTHER): Payer: BLUE CROSS/BLUE SHIELD | Admitting: Obstetrics & Gynecology

## 2016-02-28 ENCOUNTER — Encounter: Payer: Self-pay | Admitting: Obstetrics & Gynecology

## 2016-02-28 VITALS — BP 96/60 | HR 66 | Resp 14 | Ht 62.5 in | Wt 174.0 lb

## 2016-02-28 DIAGNOSIS — M8580 Other specified disorders of bone density and structure, unspecified site: Secondary | ICD-10-CM | POA: Diagnosis not present

## 2016-02-28 DIAGNOSIS — Z01419 Encounter for gynecological examination (general) (routine) without abnormal findings: Secondary | ICD-10-CM

## 2016-02-28 DIAGNOSIS — Z205 Contact with and (suspected) exposure to viral hepatitis: Secondary | ICD-10-CM

## 2016-02-28 DIAGNOSIS — H65192 Other acute nonsuppurative otitis media, left ear: Secondary | ICD-10-CM | POA: Diagnosis not present

## 2016-02-28 DIAGNOSIS — Z0000 Encounter for general adult medical examination without abnormal findings: Secondary | ICD-10-CM

## 2016-02-28 LAB — POCT URINALYSIS DIPSTICK
Bilirubin, UA: NEGATIVE
Blood, UA: NEGATIVE
Glucose, UA: NEGATIVE
Ketones, UA: NEGATIVE
NITRITE UA: NEGATIVE
Protein, UA: NEGATIVE
UROBILINOGEN UA: NEGATIVE
pH, UA: 7

## 2016-02-28 LAB — HEPATITIS C ANTIBODY: HCV Ab: NEGATIVE

## 2016-02-28 LAB — LIPID PANEL
CHOL/HDL RATIO: 2.5 ratio (ref <=5.0)
Cholesterol: 196 mg/dL (ref 125–200)
HDL: 80 mg/dL (ref >=46)
LDL Cholesterol: 104 mg/dL (ref <130)
Triglycerides: 58 mg/dL (ref <150)
VLDL: 12 mg/dL (ref <30)

## 2016-02-28 LAB — HEMOGLOBIN, FINGERSTICK: HEMOGLOBIN, FINGERSTICK: 14.3 g/dL (ref 12.0–16.0)

## 2016-02-28 MED ORDER — AMOXICILLIN 500 MG PO CAPS: 500.0000 mg | ORAL_CAPSULE | Freq: Two times a day (BID) | ORAL | Status: DC

## 2016-02-28 NOTE — Addendum Note (Signed)
Addended by: Alfonzo Feller on: 02/28/2016 10:06 AM   Modules accepted: Orders, Medications, SmartSet

## 2016-02-28 NOTE — Progress Notes (Signed)
60 y.o. G0P0000 SingleCaucasianF here for annual exam.  Wants to discuss possible change in PCP.  Wondering about geriatric care at this point.  Denies vaginal bleeding.  Reports recent issues with sinus and ear pressure.  Flew to [place] this past week.  Hasn't taken anything for this except Mucinex for a couple of days.    Patient's last menstrual period was 03/05/2007.          Sexually active: No.  The current method of family planning is post menopausal status.    Exercising: Yes.    Walk, elliptical, hike, yard work Smoker:  no  Health Maintenance: Pap:  12/07/14 Negative. 08/19/12 Neg. HR HPV:neg History of abnormal Pap:  no MMG:  07/28/15 BIRADS2:Benign  Colonoscopy:  01/27/13 Normal  BMD:   01/26/16 Osteopenia, -2.3.  8% change. TDaP:  12/07/2014  Zostavax:  Not completed Screening Labs: Here, Hb today: 14.3, Urine today: WBC=Small   reports that she has never smoked. She has never used smokeless tobacco. She reports that she drinks alcohol. She reports that she does not use illicit drugs.  Past Medical History  Diagnosis Date  . Hx of malignant melanoma 02/28/2013    2003 followed by derm.    . Palpitations   . Hx of breast biopsy 2000  . Anemia   . Cervical polyp 2007  . Vitamin D deficiency   . Osteopenia     mild  . Dysrhythmia     PMH : PVC's as a child only; benign  . Headache     had only with intolerance to medication  . Left radial fracture 06/2015    Past Surgical History  Procedure Laterality Date  . Tonsillectomy  1960  . Breast lumpectomy  1984    left breast  . Colonoscopy    . Wisdom tooth extraction    . Open reduction internal fixation (orif) wrist with radial bone graft Left 06/19/2015    Procedure: OPEN REDUCTION INTERNAL FIXATION (ORIF) LEFT WRIST WITH RADIUS AND ULNA WITH ALLOGRAFT BONE GRAFT;  Surgeon: Roseanne Kaufman, MD;  Location: [facility];  Service: Orthopedics;  Laterality: Left;    Current Outpatient Prescriptions  Medication Sig Dispense  Refill  . Ascorbic Acid (VITAMIN C) 1000 MG tablet Take 1,000 mg by mouth daily.    . calcium carbonate (TUMS EX) 750 MG chewable tablet Chew 2 tablets by mouth daily.    . Multiple Vitamin (MULTIVITAMIN WITH MINERALS) TABS tablet Take 1 tablet by mouth daily.    . Omega-3 1000 MG CAPS Take by mouth.    Marland Kitchen OVER THE COUNTER MEDICATION Place 1 drop into both eyes 2 (two) times daily as needed (dry eyes). Over the counter saline eye drops    . OVER THE COUNTER MEDICATION Take 1 tablet by mouth every 3 (three) hours as needed (cold symptoms (and 48 hours after cold symptoms subside)). Sambucol (OTC)    . Vitamin D, Ergocalciferol, (DRISDOL) 50000 units CAPS capsule TAKE 1 CAPSULE BY MOUTH ONCE A WEEK 12 capsule 0  . naproxen sodium (ANAPROX) 220 MG tablet Take 220-440 mg by mouth as needed (pain). Reported on 02/28/2016    . polyethylene glycol (MIRALAX / GLYCOLAX) packet Take 17 g by mouth daily. Reported on 02/28/2016    . senna-docusate (PERI-COLACE) 8.6-50 MG per tablet Take 2-3 tablets by mouth 2 (two) times daily. Reported on 02/28/2016     No current facility-administered medications for this visit.    Family History  Problem Relation Age of  Onset  . Hypertension Paternal Uncle   . Lung cancer    . Stroke    . Other      mom viral meningitis  . Osteoarthritis    . Alcoholism    . Breast cancer Paternal Aunt   . Breast cancer Other     paternal great aunt    ROS:  Pertinent items are noted in HPI.  Otherwise, a comprehensive ROS was negative.  Exam:   BP 96/60 mmHg  Pulse 66  Resp 14  Ht 5' 2.5" (1.588 m)  Wt 174 lb (78.926 kg)  BMI 31.30 kg/m2  LMP 03/05/2007    Height: 5' 2.5" (158.8 cm)  Ht Readings from Last 3 Encounters:  02/28/16 5' 2.5" (1.588 m)  06/19/15 5\' 3"  (1.6 m)  02/08/15 5' 2.5" (1.588 m)    General appearance: alert, cooperative and appears stated age Head: Normocephalic, without obvious abnormality, atraumatic Neck: no adenopathy, supple, symmetrical,  trachea midline and thyroid normal to inspection and palpation Ear:  Tympanic membrane on left is erythematous as is canal.  Right is normal, no periauricular LAD Lungs: clear to auscultation bilaterally Breasts: normal appearance, no masses or tenderness Heart: regular rate and rhythm Abdomen: soft, non-tender; bowel sounds normal; no masses,  no organomegaly Extremities: extremities normal, atraumatic, no cyanosis or edema Skin: Skin color, texture, turgor normal. No rashes or lesions Lymph nodes: Cervical, supraclavicular, and axillary nodes normal. No abnormal inguinal nodes palpated Neurologic: Grossly normal   Pelvic: External genitalia:  no lesions              Urethra:  normal appearing urethra with no masses, tenderness or lesions              Bartholins and Skenes: normal                 Vagina: normal appearing vagina with normal color and discharge, no lesions, atrophic              Cervix: no lesions              Pap taken: No. Bimanual Exam:  Uterus:  normal size, contour, position, consistency, mobility, non-tender              Adnexa: normal adnexa and no mass, fullness, tenderness               Rectovaginal: Confirms               Anus:  normal sphincter tone, no lesions  Chaperone was present for exam.  A:  Well Woman with normal exam H/O melanoma.  Seen yearly by derm.  [facility]. Vit D deficiency Osteopenia  Otitis media  P: Mammogram yearly pap smear with neg HR HPV 2013. Pap neg 1/16.  No pap today. Vit D today.Pt taking Vit 50K weekly. May want to use mail order and will let me know when lab results are back. Repeat BMD 2-3 year Vit D, Lipids Hep C testing PTH with calcium Zyrtec nightly for 3-4 days.  Amoxicillin 500mg  bid x 7 days.  Pt will let us know if wants Zostavax order sent to pharmacy. return annually or prn

## 2016-02-28 NOTE — Patient Instructions (Addendum)
[facility] [place], [place] [address] Get Driving Directions Main: [phone]  Dr. Hollace Kinnier  Check about coverage for the shingles vaccine--Zostavax.    Derm:  Dr. Derrel Nip

## 2016-02-29 LAB — PTH, INTACT AND CALCIUM
CALCIUM: 9.7 mg/dL (ref 8.4–10.5)
PTH: 56 pg/mL (ref 14–64)

## 2016-02-29 LAB — VITAMIN D 25 HYDROXY (VIT D DEFICIENCY, FRACTURES): VIT D 25 HYDROXY: 39 ng/mL (ref 30–100)

## 2016-03-07 ENCOUNTER — Other Ambulatory Visit: Payer: Self-pay | Admitting: *Deleted

## 2016-03-07 MED ORDER — VITAMIN D (ERGOCALCIFEROL) 1.25 MG (50000 UNIT) PO CAPS: 50000.0000 [IU] | ORAL_CAPSULE | ORAL | Status: DC

## 2016-03-07 NOTE — Telephone Encounter (Signed)
Faxed Medication refill request: Vitamin D 50,000 iu's Last AEX:   02/28/16 with SM  Next AEX: 07/13/2017 SM Last Vitamin D Level Checked: 02/28/16 at 39 Refill authorized: Please advise  Per last vitamin d level checked your recommendations was for patient to still continue on vitamin d level dose.

## 2016-05-04 ENCOUNTER — Other Ambulatory Visit: Payer: Self-pay | Admitting: Obstetrics & Gynecology

## 2016-05-04 ENCOUNTER — Encounter: Payer: Self-pay | Admitting: Obstetrics & Gynecology

## 2016-05-26 ENCOUNTER — Telehealth: Payer: Self-pay | Admitting: *Deleted

## 2016-05-26 MED ORDER — ZOSTER VACCINE LIVE 19400 UNT/0.65ML ~~LOC~~ SUSR: 0.6500 mL | Freq: Once | SUBCUTANEOUS | Status: DC

## 2016-05-26 NOTE — Telephone Encounter (Signed)
Patient calling from pharmacy. Shingles vaccine is not at pharmacy as Dr Sabra Heck said it would be. See My Chart message from 05-04-16. Apologized for delay, Advised this due to being under immunizations and will change to dispense at pharmacy now. Patietn appreciative.  Routing to provider for final review. Patient agreeable to disposition. Will close encounter.

## 2016-06-15 ENCOUNTER — Telehealth: Payer: Self-pay | Admitting: Obstetrics & Gynecology

## 2016-06-15 NOTE — Telephone Encounter (Signed)
Contacted [employer] at [phone] regarding pharmacy override for Vitamin D. Per representative the patient is to contact her pharmacy to request an override. At which time her pharmacy will contact [employer] to request an override. She will need to provide information to the pharmacy regarding when she is leaving and returning to town. Advised I will contact the patient to have her contact her pharmacy to proceed with override. Per representative there is nothing that needs to be performed by our office to complete override.  Spoke with patient. Advised of message from [employer]. The patient is agreeable and will contact her pharmacy at this time. She will return call if she needs any further assistance.  Routing to provider for final review. Patient agreeable to disposition. Will close encounter.

## 2016-06-15 NOTE — Telephone Encounter (Signed)
Patient called and said, "She needs six months of vitamin D for vacation over-ride."  Insurance company prior authorization: [phone]

## 2016-06-28 DIAGNOSIS — M65351 Trigger finger, right little finger: Secondary | ICD-10-CM | POA: Diagnosis not present

## 2016-06-28 DIAGNOSIS — D1801 Hemangioma of skin and subcutaneous tissue: Secondary | ICD-10-CM | POA: Diagnosis not present

## 2016-06-28 DIAGNOSIS — L570 Actinic keratosis: Secondary | ICD-10-CM | POA: Diagnosis not present

## 2016-06-28 DIAGNOSIS — L814 Other melanin hyperpigmentation: Secondary | ICD-10-CM | POA: Diagnosis not present

## 2016-06-28 DIAGNOSIS — S52562D Barton's fracture of left radius, subsequent encounter for closed fracture with routine healing: Secondary | ICD-10-CM | POA: Diagnosis not present

## 2016-09-11 NOTE — Telephone Encounter (Signed)
Ok to close

## 2016-12-06 DIAGNOSIS — Z1231 Encounter for screening mammogram for malignant neoplasm of breast: Secondary | ICD-10-CM | POA: Diagnosis not present

## 2016-12-06 DIAGNOSIS — Z803 Family history of malignant neoplasm of breast: Secondary | ICD-10-CM | POA: Diagnosis not present

## 2016-12-06 LAB — HM MAMMOGRAPHY

## 2016-12-07 ENCOUNTER — Encounter: Payer: Self-pay | Admitting: Family Medicine

## 2016-12-14 DIAGNOSIS — H04123 Dry eye syndrome of bilateral lacrimal glands: Secondary | ICD-10-CM | POA: Diagnosis not present

## 2016-12-14 DIAGNOSIS — H01024 Squamous blepharitis left upper eyelid: Secondary | ICD-10-CM | POA: Diagnosis not present

## 2016-12-14 DIAGNOSIS — H01021 Squamous blepharitis right upper eyelid: Secondary | ICD-10-CM | POA: Diagnosis not present

## 2016-12-14 DIAGNOSIS — H35373 Puckering of macula, bilateral: Secondary | ICD-10-CM | POA: Diagnosis not present

## 2016-12-28 ENCOUNTER — Encounter: Payer: Self-pay | Admitting: Obstetrics & Gynecology

## 2017-03-18 ENCOUNTER — Other Ambulatory Visit: Payer: Self-pay | Admitting: Obstetrics & Gynecology

## 2017-03-19 NOTE — Telephone Encounter (Signed)
Medication refill request: Vitamin D 50000 Last AEX:  02/28/16 SM Next AEX: 07/13/17  Last MMG (if hormonal medication request): 12/06/16 BIRADS 1 negative Refill authorized: 03/07/16 #12 w/4 refills; today please advise

## 2017-03-21 DIAGNOSIS — L821 Other seborrheic keratosis: Secondary | ICD-10-CM | POA: Diagnosis not present

## 2017-03-21 DIAGNOSIS — L570 Actinic keratosis: Secondary | ICD-10-CM | POA: Diagnosis not present

## 2017-03-21 DIAGNOSIS — D225 Melanocytic nevi of trunk: Secondary | ICD-10-CM | POA: Diagnosis not present

## 2017-03-21 DIAGNOSIS — L820 Inflamed seborrheic keratosis: Secondary | ICD-10-CM | POA: Diagnosis not present

## 2017-03-21 DIAGNOSIS — L608 Other nail disorders: Secondary | ICD-10-CM | POA: Diagnosis not present

## 2017-03-21 DIAGNOSIS — D1801 Hemangioma of skin and subcutaneous tissue: Secondary | ICD-10-CM | POA: Diagnosis not present

## 2017-06-02 DIAGNOSIS — S1091XA Abrasion of unspecified part of neck, initial encounter: Secondary | ICD-10-CM | POA: Diagnosis not present

## 2017-07-10 DIAGNOSIS — H00024 Hordeolum internum left upper eyelid: Secondary | ICD-10-CM | POA: Diagnosis not present

## 2017-07-13 ENCOUNTER — Ambulatory Visit: Payer: BLUE CROSS/BLUE SHIELD | Admitting: Obstetrics & Gynecology

## 2017-08-03 DIAGNOSIS — H01021 Squamous blepharitis right upper eyelid: Secondary | ICD-10-CM | POA: Diagnosis not present

## 2017-08-03 DIAGNOSIS — H01024 Squamous blepharitis left upper eyelid: Secondary | ICD-10-CM | POA: Diagnosis not present

## 2017-08-03 DIAGNOSIS — H0014 Chalazion left upper eyelid: Secondary | ICD-10-CM | POA: Diagnosis not present

## 2017-08-24 ENCOUNTER — Encounter: Payer: Self-pay | Admitting: Internal Medicine

## 2017-09-13 ENCOUNTER — Ambulatory Visit: Payer: BLUE CROSS/BLUE SHIELD | Admitting: Obstetrics & Gynecology

## 2017-10-03 ENCOUNTER — Encounter: Payer: BLUE CROSS/BLUE SHIELD | Admitting: Internal Medicine

## 2018-01-11 ENCOUNTER — Ambulatory Visit: Payer: BLUE CROSS/BLUE SHIELD | Admitting: Obstetrics & Gynecology

## 2018-01-15 ENCOUNTER — Other Ambulatory Visit: Payer: Self-pay | Admitting: Obstetrics & Gynecology

## 2018-01-15 NOTE — Telephone Encounter (Signed)
Medication refill request: vitamin D  Last AEX:  02-28-16  Next AEX: 08-16-18  Last MMG (if hormonal medication request): 12-06-16 WNL  Refill authorized: please advise

## 2018-01-24 NOTE — Progress Notes (Signed)
Chief Complaint  Patient presents with  . Hip Pain    Bilateral hip pain since August "off and on". Pt states that pain "moves around", at times she will feel the pain radiate to quads, calf, shin and sometimes in her back. Pt cannot sleep on sides d/t pain. Left pain is worse than the right.    HPI: Natalie [name] 62 y.o. come in for   Above    Sx are off and on since August but   Getting worse in general .  See above worse  after walking  and lower leg sx are some better  .   No injury not consistent  No  gu gi sx with  This and no weakness.  No fever  No systemic    Sx   hc of walking for  Hours .   Stiff and walking.     Worse after sitting.    Tried moms topical  voltaren with help short term.   Going out of country [place] march ask a bout immunix and to get cpx in a week .   Is on vit d per gyne     ROS: See pertinent positives and negatives per HPI. Ho fall swelling  nuero pain rash   Past Medical History:  Diagnosis Date  . Anemia   . Cervical polyp 2007  . Dysrhythmia    PMH : PVC's as a child only; benign  . Headache    had only with intolerance to medication  . Hx of breast biopsy 2000  . Hx of malignant melanoma 02/28/2013   2003 followed by derm.    . Left radial fracture 06/2015  . Osteopenia    mild  . Palpitations   . Vitamin D deficiency     Family History  Problem Relation Age of Onset  . Hypertension Paternal Uncle   . Lung cancer Unknown   . Stroke Unknown   . Other Unknown        mom viral meningitis  . Osteoarthritis Unknown   . Alcoholism Unknown   . Breast cancer Paternal Aunt   . Breast cancer Other        paternal great aunt    Social History   Socioeconomic History  . Marital status: Single    Spouse name: None  . Number of children: None  . Years of education: None  . Highest education level: None  Social Needs  . Financial resource strain: None  . Food insecurity - worry: None  . Food insecurity - inability: None  .  Transportation needs - medical: None  . Transportation needs - non-medical: None  Occupational History  . None  Tobacco Use  . Smoking status: Never Smoker  . Smokeless tobacco: Never Used  Substance and Sexual Activity  . Alcohol use: Yes    Alcohol/week: 0.0 - 1.0 oz    Comment: occ  . Drug use: No  . Sexual activity: No    Birth control/protection: Post-menopausal  Other Topics Concern  . None  Social History Narrative   hh of 2    Neg td    [employer] sports and exercise science    Works as Physical therapist    Has been PE teacher     Outpatient Medications Prior to Visit  Medication Sig Dispense Refill  . Ascorbic Acid (VITAMIN C) 1000 MG tablet Take 1,000 mg by mouth daily.    . calcium carbonate (TUMS EX) 750 MG chewable tablet Chew  2 tablets by mouth daily.    . Multiple Vitamin (MULTIVITAMIN WITH MINERALS) TABS tablet Take 1 tablet by mouth daily.    . naproxen sodium (ANAPROX) 220 MG tablet Take 220-440 mg by mouth as needed (pain). Reported on 02/28/2016    . Omega-3 1000 MG CAPS Take by mouth.    Marland Kitchen OVER THE COUNTER MEDICATION Place 1 drop into both eyes 2 (two) times daily as needed (dry eyes). Over the counter saline eye drops    . OVER THE COUNTER MEDICATION Take 1 tablet by mouth every 3 (three) hours as needed (cold symptoms (and 48 hours after cold symptoms subside)). Sambucol (OTC)    . Vitamin D, Ergocalciferol, (DRISDOL) 50000 units CAPS capsule TAKE ONE CAPSULE BY MOUTH ONCE A WEEK 12 capsule 2  . Zoster Vaccine Live, PF, (ZOSTAVAX) 13244 UNT/0.65ML injection Inject 19,400 Units into the skin once. 1 each 0  . amoxicillin (AMOXIL) 500 MG capsule Take 1 capsule (500 mg total) by mouth 2 (two) times daily. One capsule TID for 7 Days (Patient not taking: Reported on 01/25/2018) 14 capsule 0   No facility-administered medications prior to visit.      EXAM:  BP 128/82 (BP Location: Right Arm, Patient Position: Sitting, Cuff Size: Normal)   Pulse 64   Temp 97.8 F  (36.6 C) (Oral)   Wt 172 lb 12.8 oz (78.4 kg)   LMP 03/05/2007   BMI 31.10 kg/m   Body mass index is 31.1 kg/m.  GENERAL: vitals reviewed and listed above, alert, oriented, appears well hydrated and in no acute distress HEENT: atraumatic, conjunctiva  clear, no obvious abnormalities on inspection of external nose and ears  NECK: no obvious masses on inspection palpation  CV: HRRR, no clubbing cyanosis or  peripheral edema nl cap refill  Abdomen:  Sof,t normal bowel sounds without hepatosplenomegaly, no guarding rebound or masses no CVA tenderness MS: moves all extremities without noticeable focal  Abnormality  Mild tenderness left  Later hip  Le  Nl gait and strength   PSYCH: pleasant and cooperative,   BP Readings from Last 3 Encounters:  01/25/18 128/82  02/28/16 96/60  06/20/15 (!) 110/58    ASSESSMENT AND PLAN:  Discussed the following assessment and plan:  Hip pain, bilateral - Plan: Basic metabolic panel, CBC with Differential/Platelet, Hepatic function panel, Lipid panel, TSH, VITAMIN D 25 Hydroxy (Vit-D Deficiency, Fractures), Sedimentation rate, Ambulatory referral to Sports Medicine   preventive health   lab  pre visit  - Plan: Basic metabolic panel, CBC with Differential/Platelet, Hepatic function panel, Lipid panel, TSH, VITAMIN D 25 Hydroxy (Vit-D Deficiency, Fractures), Sedimentation rate  Vitamin D deficiency - Plan: Basic metabolic panel, CBC with Differential/Platelet, Hepatic function panel, Lipid panel, TSH, VITAMIN D 25 Hydroxy (Vit-D Deficiency, Fractures), Sedimentation rate  Pain of both hip joints - Plan: Basic metabolic panel, CBC with Differential/Platelet, Hepatic function panel, Lipid panel, TSH, VITAMIN D 25 Hydroxy (Vit-D Deficiency, Fractures), Sedimentation rate, Ambulatory referral to Sports Medicine Disc  At length   options  Use antiinflammatory .     For 10 - 14 days   And get  Sport medicine  Opinion will need imaging  Pt prefers dr Oneida Alar if   Taking patients     Out of town March 2-9th .  Will place referral  I don't think an alarming sx    But needs more evaluation ans still seems mechanical back seems ok   Exam is reassuring today  Will get cpx labs  today for conveneience with esr Total visit 45mns > 50% spent counseling and coordinating care as indicated in above note and in instructions to patient .    Expectant management.referra; options  Will review immunize at  cpx and plan but usually  Td and hep updates  -Patient advised to return or notify health care team  if  new concerns arise.  Patient Instructions  This could be a bursitis  Other hip vs back    .   Advise   Would   Add antiinflammatory . For 10 - 14 days.  Will do a referral .  To sports medicine .    Lab today to include vit d  level    Hip Bursitis Hip bursitis is inflammation of a fluid-filled sac (bursa) in the hip joint. The bursa protects the bones in the hip joint from rubbing against each other. Hip bursitis can cause mild to moderate pain, and symptoms often come and go over time. What are the causes? This condition may be caused by:  Injury to the hip.  Overuse of the muscles that surround the hip joint.  Arthritis or gout.  Diabetes.  Thyroid disease.  Cold weather.  Infection.  In some cases, the cause may not be known. What are the signs or symptoms? Symptoms of this condition may include:  Mild or moderate pain in the hip area. Pain may get worse with movement.  Tenderness and swelling of the hip, especially on the outer side of the hip.  Symptoms may come and go. If the bursa becomes infected, you may have the following symptoms:  Fever.  Red skin and a feeling of warmth in the hip area.  How is this diagnosed? This condition may be diagnosed based on:  A physical exam.  Your medical history.  X-rays.  Removal of fluid from your inflamed bursa for testing (biopsy).  You may be sent to a health care provider who  specializes in bone diseases (orthopedist) or a provider who specializes in joint inflammation (rheumatologist). How is this treated? This condition is treated by resting, raising (elevating), and applying pressure(compression) to the injured area. In some cases, this may be enough to make your symptoms go away. Treatment may also include:  Crutches.  Antibiotic medicine.  Draining fluid out of the bursa to help relieve swelling.  Injecting medicine that helps to reduce inflammation (cortisone).  Follow these instructions at home: Medicines  Take over-the-counter and prescription medicines only as told by your health care provider.  Do not drive or operate heavy machinery while taking prescription pain medicine, or as told by your health care provider.  If you were prescribed an antibiotic, take it as told by your health care provider. Do not stop taking the antibiotic even if you start to feel better. Activity  Return to your normal activities as told by your health care provider. Ask your health care provider what activities are safe for you.  Rest and protect your hip as much as possible until your pain and swelling get better. General instructions  Wear compression wraps only as told by your health care provider.  Elevate your hip above the level of your heart as much as you can without pain. To do this, try putting a pillow under your hips while you lie down.  Do not use your hip to support your body weight until your health care provider says that you can. Use crutches as told by your health care provider.  Gently massage  and stretch your injured area as often as is comfortable.  Keep all follow-up visits as told by your health care provider. This is important. How is this prevented?  Exercise regularly, as told by your health care provider.  Warm up and stretch before being active.  Cool down and stretch after being active.  If an activity irritates your hip or  causes pain, avoid the activity as much as possible.  Avoid sitting down for long periods at a time. Contact a health care provider if:  You have a fever.  You develop new symptoms.  You have difficulty walking or doing everyday activities.  You have pain that gets worse or does not get better with medicine.  You develop red skin or a feeling of warmth in your hip area. Get help right away if:  You cannot move your hip.  You have severe pain. This information is not intended to replace advice given to you by your health care provider. Make sure you discuss any questions you have with your health care provider. Document Released: 05/12/2002 Document Revised: 04/27/2016 Document Reviewed: 06/22/2015 Elsevier Interactive Patient Education  2018 [place]. Cayman Kielbasa M.D.

## 2018-01-25 ENCOUNTER — Encounter: Payer: Self-pay | Admitting: Internal Medicine

## 2018-01-25 ENCOUNTER — Ambulatory Visit: Payer: BLUE CROSS/BLUE SHIELD | Admitting: Internal Medicine

## 2018-01-25 VITALS — BP 128/82 | HR 64 | Temp 97.8°F | Wt 172.8 lb

## 2018-01-25 DIAGNOSIS — M25551 Pain in right hip: Secondary | ICD-10-CM | POA: Diagnosis not present

## 2018-01-25 DIAGNOSIS — M25552 Pain in left hip: Secondary | ICD-10-CM | POA: Diagnosis not present

## 2018-01-25 DIAGNOSIS — H2513 Age-related nuclear cataract, bilateral: Secondary | ICD-10-CM | POA: Diagnosis not present

## 2018-01-25 DIAGNOSIS — H01021 Squamous blepharitis right upper eyelid: Secondary | ICD-10-CM | POA: Diagnosis not present

## 2018-01-25 DIAGNOSIS — H01024 Squamous blepharitis left upper eyelid: Secondary | ICD-10-CM | POA: Diagnosis not present

## 2018-01-25 DIAGNOSIS — H04123 Dry eye syndrome of bilateral lacrimal glands: Secondary | ICD-10-CM | POA: Diagnosis not present

## 2018-01-25 DIAGNOSIS — Z0000 Encounter for general adult medical examination without abnormal findings: Secondary | ICD-10-CM

## 2018-01-25 DIAGNOSIS — E559 Vitamin D deficiency, unspecified: Secondary | ICD-10-CM | POA: Diagnosis not present

## 2018-01-25 LAB — BASIC METABOLIC PANEL
BUN: 18 mg/dL (ref 6–23)
CALCIUM: 9.9 mg/dL (ref 8.4–10.5)
CO2: 29 mEq/L (ref 19–32)
Chloride: 107 mEq/L (ref 96–112)
Creatinine, Ser: 0.88 mg/dL (ref 0.40–1.20)
GFR: 69.12 mL/min (ref >60.00)
Glucose, Bld: 87 mg/dL (ref 70–99)
Potassium: 4.3 mEq/L (ref 3.5–5.1)
SODIUM: 142 meq/L (ref 135–145)

## 2018-01-25 LAB — HEPATIC FUNCTION PANEL
ALK PHOS: 66 U/L (ref 39–117)
ALT: 15 U/L (ref 0–35)
AST: 18 U/L (ref 0–37)
Albumin: 4.2 g/dL (ref 3.5–5.2)
Bilirubin, Direct: 0.1 mg/dL (ref 0.0–0.3)
Total Bilirubin: 0.8 mg/dL (ref 0.2–1.2)
Total Protein: 6.6 g/dL (ref 6.0–8.3)

## 2018-01-25 LAB — CBC WITH DIFFERENTIAL/PLATELET
Basophils Absolute: 0.1 10*3/uL (ref 0.0–0.1)
Basophils Relative: 1.8 % (ref 0.0–3.0)
Eosinophils Absolute: 0.1 10*3/uL (ref 0.0–0.7)
Eosinophils Relative: 2.9 % (ref 0.0–5.0)
HEMATOCRIT: 42.1 % (ref 36.0–46.0)
Hemoglobin: 14.5 g/dL (ref 12.0–15.0)
LYMPHS ABS: 1.3 10*3/uL (ref 0.7–4.0)
LYMPHS PCT: 28.6 % (ref 12.0–46.0)
MCHC: 34.5 g/dL (ref 30.0–36.0)
MCV: 87.6 fl (ref 78.0–100.0)
MONOS PCT: 7.7 % (ref 3.0–12.0)
Monocytes Absolute: 0.3 10*3/uL (ref 0.1–1.0)
NEUTROS PCT: 59 % (ref 43.0–77.0)
Neutro Abs: 2.7 10*3/uL (ref 1.4–7.7)
PLATELETS: 255 10*3/uL (ref 150.0–400.0)
RBC: 4.8 Mil/uL (ref 3.87–5.11)
RDW: 12.9 % (ref 11.5–15.5)
WBC: 4.5 10*3/uL (ref 4.0–10.5)

## 2018-01-25 LAB — LIPID PANEL
Cholesterol: 203 mg/dL — ABNORMAL HIGH (ref 0–200)
HDL: 83 mg/dL (ref >39.00)
LDL Cholesterol: 104 mg/dL — ABNORMAL HIGH (ref 0–99)
NONHDL: 119.51
Total CHOL/HDL Ratio: 2
Triglycerides: 80 mg/dL (ref 0.0–149.0)
VLDL: 16 mg/dL (ref 0.0–40.0)

## 2018-01-25 LAB — TSH: TSH: 2.3 u[IU]/mL (ref 0.35–4.50)

## 2018-01-25 LAB — VITAMIN D 25 HYDROXY (VIT D DEFICIENCY, FRACTURES): VITD: 31.59 ng/mL (ref 30.00–100.00)

## 2018-01-25 LAB — SEDIMENTATION RATE: Sed Rate: 3 mm/hr (ref 0–30)

## 2018-01-25 MED ORDER — NAPROXEN 500 MG PO TABS: 500.0000 mg | ORAL_TABLET | Freq: Two times a day (BID) | ORAL | 0 refills | Status: DC

## 2018-01-25 NOTE — Patient Instructions (Addendum)
This could be a bursitis  Other hip vs back    .   Advise   Would   Add antiinflammatory . For 10 - 14 days.  Will do a referral .  To sports medicine .    Lab today to include vit d  level    Hip Bursitis Hip bursitis is inflammation of a fluid-filled sac (bursa) in the hip joint. The bursa protects the bones in the hip joint from rubbing against each other. Hip bursitis can cause mild to moderate pain, and symptoms often come and go over time. What are the causes? This condition may be caused by:  Injury to the hip.  Overuse of the muscles that surround the hip joint.  Arthritis or gout.  Diabetes.  Thyroid disease.  Cold weather.  Infection.  In some cases, the cause may not be known. What are the signs or symptoms? Symptoms of this condition may include:  Mild or moderate pain in the hip area. Pain may get worse with movement.  Tenderness and swelling of the hip, especially on the outer side of the hip.  Symptoms may come and go. If the bursa becomes infected, you may have the following symptoms:  Fever.  Red skin and a feeling of warmth in the hip area.  How is this diagnosed? This condition may be diagnosed based on:  A physical exam.  Your medical history.  X-rays.  Removal of fluid from your inflamed bursa for testing (biopsy).  You may be sent to a health care provider who specializes in bone diseases (orthopedist) or a provider who specializes in joint inflammation (rheumatologist). How is this treated? This condition is treated by resting, raising (elevating), and applying pressure(compression) to the injured area. In some cases, this may be enough to make your symptoms go away. Treatment may also include:  Crutches.  Antibiotic medicine.  Draining fluid out of the bursa to help relieve swelling.  Injecting medicine that helps to reduce inflammation (cortisone).  Follow these instructions at home: Medicines  Take over-the-counter and  prescription medicines only as told by your health care provider.  Do not drive or operate heavy machinery while taking prescription pain medicine, or as told by your health care provider.  If you were prescribed an antibiotic, take it as told by your health care provider. Do not stop taking the antibiotic even if you start to feel better. Activity  Return to your normal activities as told by your health care provider. Ask your health care provider what activities are safe for you.  Rest and protect your hip as much as possible until your pain and swelling get better. General instructions  Wear compression wraps only as told by your health care provider.  Elevate your hip above the level of your heart as much as you can without pain. To do this, try putting a pillow under your hips while you lie down.  Do not use your hip to support your body weight until your health care provider says that you can. Use crutches as told by your health care provider.  Gently massage and stretch your injured area as often as is comfortable.  Keep all follow-up visits as told by your health care provider. This is important. How is this prevented?  Exercise regularly, as told by your health care provider.  Warm up and stretch before being active.  Cool down and stretch after being active.  If an activity irritates your hip or causes pain, avoid the activity  as much as possible.  Avoid sitting down for long periods at a time. Contact a health care provider if:  You have a fever.  You develop new symptoms.  You have difficulty walking or doing everyday activities.  You have pain that gets worse or does not get better with medicine.  You develop red skin or a feeling of warmth in your hip area. Get help right away if:  You cannot move your hip.  You have severe pain. This information is not intended to replace advice given to you by your health care provider. Make sure you discuss any  questions you have with your health care provider. Document Released: 05/12/2002 Document Revised: 04/27/2016 Document Reviewed: 06/22/2015 Elsevier Interactive Patient Education  [employer].

## 2018-01-31 NOTE — Progress Notes (Signed)
Chief Complaint  Patient presents with  . Annual Exam    No new concerns.     HPI: Patient  Natalie [name]  62 y.o. comes in today for Preventive Health Care visit  No new issues since last visit to see Dr Oneida Alar  In a few weeks . nsaid seem to help some but in with activity? Needs note for work [employer]  That she had cpx  w labs   Health Maintenance  Topic Date Due  . HIV Screening  09/04/1970  . PAP SMEAR  12/07/2017  . MAMMOGRAM  12/06/2018  . COLONOSCOPY  01/27/2023  . TETANUS/TDAP  12/07/2024  . INFLUENZA VACCINE  Completed  . Hepatitis C Screening  Completed   Health Maintenance Review LIFESTYLE:  Exercise:    Walking 4- 7 days per week .  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 GI /GU: No adominal pain, vomiting, change in bowel habits. No blood in the stool. No significant GU symptoms. SKIN/HEME: ,no acute skin rashes suspicious lesions or bleeding. No lymphadenopathy, nodules, masses.  Nodule left  Finger  ? numb NEURO/ PSYCH:  No neurologic signs such as weakness numbness. No depression anxiety. IMM/ Allergy: No unusual infections.  Allergy .   REST of 12 system review negative except as per HPI   Past Medical History:  Diagnosis Date  . Anemia   . Cervical polyp 2007  . Dysrhythmia    PMH : PVC's as a child only; benign  . Headache    had only with intolerance to medication  . Hx of breast biopsy 2000  . Hx of malignant melanoma 02/28/2013   2003 followed by derm.    . Left radial fracture 06/2015  . Osteopenia    mild  . Palpitations   . Vitamin D deficiency       Past Surgical History:  Procedure Laterality Date  . BREAST LUMPECTOMY  1984   left breast  . COLONOSCOPY    . OPEN REDUCTION INTERNAL FIXATION (ORIF) WRIST WITH RADIAL BONE GRAFT Left 06/19/2015   Procedure: OPEN REDUCTION INTERNAL FIXATION (ORIF) LEFT WRIST WITH RADIUS AND ULNA WITH ALLOGRAFT BONE GRAFT;  Surgeon: Roseanne Kaufman, MD;  Location: [facility];  Service: Orthopedics;  Laterality: Left;  . TONSILLECTOMY  1960  . WISDOM TOOTH EXTRACTION      Family History  Problem Relation Age of Onset  . Hypertension Paternal Uncle   . Lung cancer Unknown   . Stroke Unknown   . Other Unknown        mom viral meningitis  . Osteoarthritis Unknown   . Alcoholism Unknown   . Breast cancer Paternal Aunt   . Breast cancer Other        paternal great aunt    Social History   Socioeconomic History  . Marital status: Single    Spouse name: None  . Number of children: None  . Years of education: None  . Highest education level: None  Social Needs  . Financial resource strain: None  . Food insecurity -  worry: None  . Food insecurity - inability: None  . Transportation needs - medical: None  . Transportation needs - non-medical: None  Occupational History  . None  Tobacco Use  . Smoking status: Never Smoker  . Smokeless tobacco: Never Used  Substance and Sexual Activity  . Alcohol use: Yes    Alcohol/week: 0.0 - 1.0 oz    Comment: occ  . Drug use: No  . Sexual activity: No    Birth control/protection: Post-menopausal  Other Topics Concern  . None  Social History Narrative   hh of 2    Neg td    [employer] sports and exercise science    Works as Physical therapist    Has been PE teacher     Outpatient Medications Prior to Visit  Medication Sig Dispense Refill  . Ascorbic Acid (VITAMIN C) 1000 MG tablet Take 1,000 mg by mouth daily.    . calcium carbonate (TUMS EX) 750 MG chewable tablet Chew 2 tablets by mouth daily.    . Multiple Vitamin (MULTIVITAMIN WITH MINERALS) TABS  tablet Take 1 tablet by mouth daily.    . naproxen (NAPROSYN) 500 MG tablet Take 1 tablet (500 mg total) by mouth 2 (two) times daily with a meal. 40 tablet 0  . naproxen sodium (ANAPROX) 220 MG tablet Take 220-440 mg by mouth as needed (pain). Reported on 02/28/2016    . Omega-3 1000 MG CAPS Take by mouth.    Marland Kitchen OVER THE COUNTER MEDICATION Place 1 drop into both eyes 2 (two) times daily as needed (dry eyes). Over the counter saline eye drops    . OVER THE COUNTER MEDICATION Take 1 tablet by mouth every 3 (three) hours as needed (cold symptoms (and 48 hours after cold symptoms subside)). Sambucol (OTC)    . Vitamin D, Ergocalciferol, (DRISDOL) 50000 units CAPS capsule TAKE ONE CAPSULE BY MOUTH ONCE A WEEK 12 capsule 2  . Zoster Vaccine Live, PF, (ZOSTAVAX) 70177 UNT/0.65ML injection Inject 19,400 Units into the skin once. (Patient not taking: Reported on 02/01/2018) 1 each 0   No facility-administered medications prior to visit.      EXAM:  BP 122/78 (BP Location: Right Arm, Patient Position: Sitting, Cuff Size: Normal)   Pulse (!) 59   Temp (!) 97.4 F (36.3 C) (Oral)   Ht 5' 1.81" (1.57 m)   Wt 174 lb 9.6 oz (79.2 kg)   LMP 03/05/2007   BMI 32.13 kg/m   Body mass index is 32.13 kg/m. Wt Readings from Last 3 Encounters:  02/01/18 174 lb 9.6 oz (79.2 kg)  01/25/18 172 lb 12.8 oz (78.4 kg)  02/28/16 174 lb (78.9 kg)    Physical Exam: Vital signs reviewed [id number] is a well-developed well-nourished alert cooperative    who appearsr stated age in no acute distress.  HEENT: normocephalic atraumatic , Eyes: PERRL EOM's full, conjunctiva clear, Nares: paten,t no deformity discharge or tenderness., Ears: no deformity EAC's clear TMs with normal landmarks. Mouth: clear OP, no lesions, edema.  Moist mucous membranes. Dentition in adequate repair. NECK: supple without masses, thyromegaly or bruits. CHEST/PULM:  Clear to auscultation and percussion breath sounds equal no wheeze , rales or  rhonchi. No chest wall deformities or tenderness. Breast: normal by inspection . No dimpling, discharge, masses, tenderness or discharge . CV: PMI is nondisplaced, S1 S2 no gallops, murmurs, rubs. Peripheral pulses are full without delay.No JVD .  ABDOMEN: Bowel sounds normal nontender  No guard or rebound, no hepato splenomegal no  CVA tenderness.  No hernia. Extremtities:  No clubbing cyanosis or edema, no acute joint swelling or redness no focal atrophy  Nodule left in finger ganglion? Mobile  NEURO:  Oriented x3, cranial nerves 3-12 appear to be intact, no obvious focal weakness,gait within normal limits today  SKIN: No acute rashes normal turgor, color, no bruising or petechiae. PSYCH: Oriented, good eye contact, no obvious depression anxiety, cognition and judgment appear normal. LN: no cervical axillary inguinal adenopathy  Lab Results  Component Value Date   WBC 4.5 01/25/2018   HGB 14.5 01/25/2018   HCT 42.1 01/25/2018   PLT 255.0 01/25/2018   GLUCOSE 87 01/25/2018   CHOL 203 (H) 01/25/2018   TRIG 80.0 01/25/2018   HDL 83.00 01/25/2018   LDLDIRECT 104.0 05/30/2010   LDLCALC 104 (H) 01/25/2018   ALT 15 01/25/2018   AST 18 01/25/2018   NA 142 01/25/2018   K 4.3 01/25/2018   CL 107 01/25/2018   CREATININE 0.88 01/25/2018   BUN 18 01/25/2018   CO2 29 01/25/2018   TSH 2.30 01/25/2018    BP Readings from Last 3 Encounters:  02/01/18 122/78  01/25/18 128/82  02/28/16 96/60    Lab results reviewed with patient   ASSESSMENT AND PLAN:  Discussed the following assessment and plan:  Visit for preventive health examination  Counseling about travel Travelers diarrhea  Med  As requested .  Patient Care Team: Apoorva Bugay, Standley Brooking, MD as PCP - General Sabra Heck Lemmie Evens, MD as Consulting Physician (Gynecology) Patient Instructions   Continue lifestyle intervention healthy eating and exercise . Can get  New shingles vaccine shingrix  when available .   Health Maintenance,  Female Adopting a healthy lifestyle and getting preventive care can go a long way to promote health and wellness. Talk with your health care provider about what schedule of regular examinations is right for you. This is a good chance for you to check in with your provider about disease prevention and staying healthy. In between checkups, there are plenty of things you can do on your own. Experts have done a lot of research about which lifestyle changes and preventive measures are most likely to keep you healthy. Ask your health care provider for more information. Weight and diet Eat a healthy diet  Be sure to include plenty of vegetables, fruits, low-fat dairy products, and lean protein.  Do not eat a lot of foods high in solid fats, added sugars, or salt.  Get regular exercise. This is one of the most important things you can do for your health. ? Most adults should exercise for at least 150 minutes each week. The exercise should increase your heart rate and make you sweat (moderate-intensity exercise). ? Most adults should also do strengthening exercises at least twice a week. This is in addition to the moderate-intensity exercise.  Maintain a healthy weight  Body mass index (BMI) is a measurement that can be used to identify possible weight problems. It estimates body fat based on height and weight. Your health care provider can help determine your BMI and help you achieve or maintain a healthy weight.  For females 14 years of age and older: ? A BMI below 18.5 is considered underweight. ? A BMI of 18.5 to 24.9 is normal. ? A BMI of 25 to 29.9 is considered overweight. ? A BMI of 30 and above is considered obese.  Watch levels of cholesterol and blood lipids  You should start having your blood tested for lipids  and cholesterol at 63 years of age, then have this test every 5 years.  You may need to have your cholesterol levels checked more often if: ? Your lipid or cholesterol levels  are high. ? You are older than 63 years of age. ? You are at high risk for heart disease.  Cancer screening Lung Cancer  Lung cancer screening is recommended for adults 22-33 years old who are at high risk for lung cancer because of a history of smoking.  A yearly low-dose CT scan of the lungs is recommended for people who: ? Currently smoke. ? Have quit within the past 15 years. ? Have at least a 30-pack-year history of smoking. A pack year is smoking an average of one pack of cigarettes a day for 1 year.  Yearly screening should continue until it has been 15 years since you quit.  Yearly screening should stop if you develop a health problem that would prevent you from having lung cancer treatment.  Breast Cancer  Practice breast self-awareness. This means understanding how your breasts normally appear and feel.  It also means doing regular breast self-exams. Let your health care provider know about any changes, no matter how small.  If you are in your 20s or 30s, you should have a clinical breast exam (CBE) by a health care provider every 1-3 years as part of a regular health exam.  If you are 23 or older, have a CBE every year. Also consider having a breast X-ray (mammogram) every year.  If you have a family history of breast cancer, talk to your health care provider about genetic screening.  If you are at high risk for breast cancer, talk to your health care provider about having an MRI and a mammogram every year.  Breast cancer gene (BRCA) assessment is recommended for women who have family members with BRCA-related cancers. BRCA-related cancers include: ? Breast. ? Ovarian. ? Tubal. ? Peritoneal cancers.  Results of the assessment will determine the need for genetic counseling and BRCA1 and BRCA2 testing.  Cervical Cancer Your health care provider may recommend that you be screened regularly for cancer of the pelvic organs (ovaries, uterus, and vagina). This screening  involves a pelvic examination, including checking for microscopic changes to the surface of your cervix (Pap test). You may be encouraged to have this screening done every 3 years, beginning at age 28.  For women ages 68-65, health care providers may recommend pelvic exams and Pap testing every 3 years, or they may recommend the Pap and pelvic exam, combined with testing for human papilloma virus (HPV), every 5 years. Some types of HPV increase your risk of cervical cancer. Testing for HPV may also be done on women of any age with unclear Pap test results.  Other health care providers may not recommend any screening for nonpregnant women who are considered low risk for pelvic cancer and who do not have symptoms. Ask your health care provider if a screening pelvic exam is right for you.  If you have had past treatment for cervical cancer or a condition that could lead to cancer, you need Pap tests and screening for cancer for at least 20 years after your treatment. If Pap tests have been discontinued, your risk factors (such as having a new sexual partner) need to be reassessed to determine if screening should resume. Some women have medical problems that increase the chance of getting cervical cancer. In these cases, your health care provider may recommend more frequent  screening and Pap tests.  Colorectal Cancer  This type of cancer can be detected and often prevented.  Routine colorectal cancer screening usually begins at 63 years of age and continues through 63 years of age.  Your health care provider may recommend screening at an earlier age if you have risk factors for colon cancer.  Your health care provider may also recommend using home test kits to check for hidden blood in the stool.  A small camera at the end of a tube can be used to examine your colon directly (sigmoidoscopy or colonoscopy). This is done to check for the earliest forms of colorectal cancer.  Routine screening usually  begins at age 68.  Direct examination of the colon should be repeated every 5-10 years through 63 years of age. However, you may need to be screened more often if early forms of precancerous polyps or small growths are found.  Skin Cancer  Check your skin from head to toe regularly.  Tell your health care provider about any new moles or changes in moles, especially if there is a change in a mole's shape or color.  Also tell your health care provider if you have a mole that is larger than the size of a pencil eraser.  Always use sunscreen. Apply sunscreen liberally and repeatedly throughout the day.  Protect yourself by wearing long sleeves, pants, a wide-brimmed hat, and sunglasses whenever you are outside.  Heart disease, diabetes, and high blood pressure  High blood pressure causes heart disease and increases the risk of stroke. High blood pressure is more likely to develop in: ? People who have blood pressure in the high end of the normal range (130-139/85-89 mm Hg). ? People who are overweight or obese. ? People who are African American.  If you are 85-72 years of age, have your blood pressure checked every 3-5 years. If you are 68 years of age or older, have your blood pressure checked every year. You should have your blood pressure measured twice-once when you are at a hospital or clinic, and once when you are not at a hospital or clinic. Record the average of the two measurements. To check your blood pressure when you are not at a hospital or clinic, you can use: ? An automated blood pressure machine at a pharmacy. ? A home blood pressure monitor.  If you are between 77 years and 35 years old, ask your health care provider if you should take aspirin to prevent strokes.  Have regular diabetes screenings. This involves taking a blood sample to check your fasting blood sugar level. ? If you are at a normal weight and have a low risk for diabetes, have this test once every three  years after 63 years of age. ? If you are overweight and have a high risk for diabetes, consider being tested at a younger age or more often. Preventing infection Hepatitis B  If you have a higher risk for hepatitis B, you should be screened for this virus. You are considered at high risk for hepatitis B if: ? You were born in a country where hepatitis B is common. Ask your health care provider which countries are considered high risk. ? Your parents were born in a high-risk country, and you have not been immunized against hepatitis B (hepatitis B vaccine). ? You have HIV or AIDS. ? You use needles to inject street drugs. ? You live with someone who has hepatitis B. ? You have had sex with someone  who has hepatitis B. ? You get hemodialysis treatment. ? You take certain medicines for conditions, including cancer, organ transplantation, and autoimmune conditions.  Hepatitis C  Blood testing is recommended for: ? Everyone born from [birth year] through 1966. ? Anyone with known risk factors for hepatitis C.  Sexually transmitted infections (STIs)  You should be screened for sexually transmitted infections (STIs) including gonorrhea and chlamydia if: ? You are sexually active and are younger than 63 years of age. ? You are older than 63 years of age and your health care provider tells you that you are at risk for this type of infection. ? Your sexual activity has changed since you were last screened and you are at an increased risk for chlamydia or gonorrhea. Ask your health care provider if you are at risk.  If you do not have HIV, but are at risk, it may be recommended that you take a prescription medicine daily to prevent HIV infection. This is called pre-exposure prophylaxis (PrEP). You are considered at risk if: ? You are sexually active and do not regularly use condoms or know the HIV status of your partner(s). ? You take drugs by injection. ? You are sexually active with a partner who has  HIV.  Talk with your health care provider about whether you are at high risk of being infected with HIV. If you choose to begin PrEP, you should first be tested for HIV. You should then be tested every 3 months for as long as you are taking PrEP. Pregnancy  If you are premenopausal and you may become pregnant, ask your health care provider about preconception counseling.  If you may become pregnant, take 400 to 800 micrograms (mcg) of folic acid every day.  If you want to prevent pregnancy, talk to your health care provider about birth control (contraception). Osteoporosis and menopause  Osteoporosis is a disease in which the bones lose minerals and strength with aging. This can result in serious bone fractures. Your risk for osteoporosis can be identified using a bone density scan.  If you are 58 years of age or older, or if you are at risk for osteoporosis and fractures, ask your health care provider if you should be screened.  Ask your health care provider whether you should take a calcium or vitamin D supplement to lower your risk for osteoporosis.  Menopause may have certain physical symptoms and risks.  Hormone replacement therapy may reduce some of these symptoms and risks. Talk to your health care provider about whether hormone replacement therapy is right for you. Follow these instructions at home:  Schedule regular health, dental, and eye exams.  Stay current with your immunizations.  Do not use any tobacco products including cigarettes, chewing tobacco, or electronic cigarettes.  If you are pregnant, do not drink alcohol.  If you are breastfeeding, limit how much and how often you drink alcohol.  Limit alcohol intake to no more than 1 drink per day for nonpregnant women. One drink equals 12 ounces of beer, 5 ounces of wine, or 1 ounces of hard liquor.  Do not use street drugs.  Do not share needles.  Ask your health care provider for help if you need support or  information about quitting drugs.  Tell your health care provider if you often feel depressed.  Tell your health care provider if you have ever been abused or do not feel safe at home. This information is not intended to replace advice given to you by your  health care provider. Make sure you discuss any questions you have with your health care provider. Document Released: 06/05/2011 Document Revised: 04/27/2016 Document Reviewed: 08/24/2015 Elsevier Interactive Patient Education  2018 [employer]. Dayana Dalporto M.D.

## 2018-02-01 ENCOUNTER — Encounter: Payer: Self-pay | Admitting: Internal Medicine

## 2018-02-01 ENCOUNTER — Ambulatory Visit (INDEPENDENT_AMBULATORY_CARE_PROVIDER_SITE_OTHER): Payer: BLUE CROSS/BLUE SHIELD | Admitting: Internal Medicine

## 2018-02-01 VITALS — BP 122/78 | HR 59 | Temp 97.4°F | Ht 61.81 in | Wt 174.6 lb

## 2018-02-01 DIAGNOSIS — Z7189 Other specified counseling: Secondary | ICD-10-CM | POA: Diagnosis not present

## 2018-02-01 DIAGNOSIS — Z0000 Encounter for general adult medical examination without abnormal findings: Secondary | ICD-10-CM

## 2018-02-01 DIAGNOSIS — Z7184 Encounter for health counseling related to travel: Secondary | ICD-10-CM

## 2018-02-01 MED ORDER — ONDANSETRON 4 MG PO TBDP: 4.0000 mg | ORAL_TABLET | Freq: Three times a day (TID) | ORAL | 0 refills | Status: DC | PRN

## 2018-02-01 MED ORDER — ZOSTER VAC RECOMB ADJUVANTED 50 MCG/0.5ML IM SUSR: 0.5000 mL | Freq: Once | INTRAMUSCULAR | 1 refills | Status: AC

## 2018-02-01 MED ORDER — AZITHROMYCIN 500 MG PO TABS: 1000.0000 mg | ORAL_TABLET | Freq: Every day | ORAL | 0 refills | Status: DC

## 2018-02-01 NOTE — Patient Instructions (Signed)
Continue lifestyle intervention healthy eating and exercise . Can get  New shingles vaccine shingrix  when available .   Health Maintenance, Female Adopting a healthy lifestyle and getting preventive care can go a long way to promote health and wellness. Talk with your health care provider about what schedule of regular examinations is right for you. This is a good chance for you to check in with your provider about disease prevention and staying healthy. In between checkups, there are plenty of things you can do on your own. Experts have done a lot of research about which lifestyle changes and preventive measures are most likely to keep you healthy. Ask your health care provider for more information. Weight and diet Eat a healthy diet  Be sure to include plenty of vegetables, fruits, low-fat dairy products, and lean protein.  Do not eat a lot of foods high in solid fats, added sugars, or salt.  Get regular exercise. This is one of the most important things you can do for your health. ? Most adults should exercise for at least 150 minutes each week. The exercise should increase your heart rate and make you sweat (moderate-intensity exercise). ? Most adults should also do strengthening exercises at least twice a week. This is in addition to the moderate-intensity exercise.  Maintain a healthy weight  Body mass index (BMI) is a measurement that can be used to identify possible weight problems. It estimates body fat based on height and weight. Your health care provider can help determine your BMI and help you achieve or maintain a healthy weight.  For females 72 years of age and older: ? A BMI below 18.5 is considered underweight. ? A BMI of 18.5 to 24.9 is normal. ? A BMI of 25 to 29.9 is considered overweight. ? A BMI of 30 and above is considered obese.  Watch levels of cholesterol and blood lipids  You should start having your blood tested for lipids and cholesterol at 63 years of  age, then have this test every 5 years.  You may need to have your cholesterol levels checked more often if: ? Your lipid or cholesterol levels are high. ? You are older than 63 years of age. ? You are at high risk for heart disease.  Cancer screening Lung Cancer  Lung cancer screening is recommended for adults 38-53 years old who are at high risk for lung cancer because of a history of smoking.  A yearly low-dose CT scan of the lungs is recommended for people who: ? Currently smoke. ? Have quit within the past 15 years. ? Have at least a 30-pack-year history of smoking. A pack year is smoking an average of one pack of cigarettes a day for 1 year.  Yearly screening should continue until it has been 15 years since you quit.  Yearly screening should stop if you develop a health problem that would prevent you from having lung cancer treatment.  Breast Cancer  Practice breast self-awareness. This means understanding how your breasts normally appear and feel.  It also means doing regular breast self-exams. Let your health care provider know about any changes, no matter how small.  If you are in your 20s or 30s, you should have a clinical breast exam (CBE) by a health care provider every 1-3 years as part of a regular health exam.  If you are 60 or older, have a CBE every year. Also consider having a breast X-ray (mammogram) every year.  If you have a  family history of breast cancer, talk to your health care provider about genetic screening.  If you are at high risk for breast cancer, talk to your health care provider about having an MRI and a mammogram every year.  Breast cancer gene (BRCA) assessment is recommended for women who have family members with BRCA-related cancers. BRCA-related cancers include: ? Breast. ? Ovarian. ? Tubal. ? Peritoneal cancers.  Results of the assessment will determine the need for genetic counseling and BRCA1 and BRCA2 testing.  Cervical  Cancer Your health care provider may recommend that you be screened regularly for cancer of the pelvic organs (ovaries, uterus, and vagina). This screening involves a pelvic examination, including checking for microscopic changes to the surface of your cervix (Pap test). You may be encouraged to have this screening done every 3 years, beginning at age 21.  For women ages 30-65, health care providers may recommend pelvic exams and Pap testing every 3 years, or they may recommend the Pap and pelvic exam, combined with testing for human papilloma virus (HPV), every 5 years. Some types of HPV increase your risk of cervical cancer. Testing for HPV may also be done on women of any age with unclear Pap test results.  Other health care providers may not recommend any screening for nonpregnant women who are considered low risk for pelvic cancer and who do not have symptoms. Ask your health care provider if a screening pelvic exam is right for you.  If you have had past treatment for cervical cancer or a condition that could lead to cancer, you need Pap tests and screening for cancer for at least 20 years after your treatment. If Pap tests have been discontinued, your risk factors (such as having a new sexual partner) need to be reassessed to determine if screening should resume. Some women have medical problems that increase the chance of getting cervical cancer. In these cases, your health care provider may recommend more frequent screening and Pap tests.  Colorectal Cancer  This type of cancer can be detected and often prevented.  Routine colorectal cancer screening usually begins at 63 years of age and continues through 63 years of age.  Your health care provider may recommend screening at an earlier age if you have risk factors for colon cancer.  Your health care provider may also recommend using home test kits to check for hidden blood in the stool.  A small camera at the end of a tube can be used to  examine your colon directly (sigmoidoscopy or colonoscopy). This is done to check for the earliest forms of colorectal cancer.  Routine screening usually begins at age 50.  Direct examination of the colon should be repeated every 5-10 years through 63 years of age. However, you may need to be screened more often if early forms of precancerous polyps or small growths are found.  Skin Cancer  Check your skin from head to toe regularly.  Tell your health care provider about any new moles or changes in moles, especially if there is a change in a mole's shape or color.  Also tell your health care provider if you have a mole that is larger than the size of a pencil eraser.  Always use sunscreen. Apply sunscreen liberally and repeatedly throughout the day.  Protect yourself by wearing long sleeves, pants, a wide-brimmed hat, and sunglasses whenever you are outside.  Heart disease, diabetes, and high blood pressure  High blood pressure causes heart disease and increases the risk of   stroke. High blood pressure is more likely to develop in: ? People who have blood pressure in the high end of the normal range (130-139/85-89 mm Hg). ? People who are overweight or obese. ? People who are African American.  If you are 18-39 years of age, have your blood pressure checked every 3-5 years. If you are 40 years of age or older, have your blood pressure checked every year. You should have your blood pressure measured twice-once when you are at a hospital or clinic, and once when you are not at a hospital or clinic. Record the average of the two measurements. To check your blood pressure when you are not at a hospital or clinic, you can use: ? An automated blood pressure machine at a pharmacy. ? A home blood pressure monitor.  If you are between 55 years and 79 years old, ask your health care provider if you should take aspirin to prevent strokes.  Have regular diabetes screenings. This involves taking a  blood sample to check your fasting blood sugar level. ? If you are at a normal weight and have a low risk for diabetes, have this test once every three years after 63 years of age. ? If you are overweight and have a high risk for diabetes, consider being tested at a younger age or more often. Preventing infection Hepatitis B  If you have a higher risk for hepatitis B, you should be screened for this virus. You are considered at high risk for hepatitis B if: ? You were born in a country where hepatitis B is common. Ask your health care provider which countries are considered high risk. ? Your parents were born in a high-risk country, and you have not been immunized against hepatitis B (hepatitis B vaccine). ? You have HIV or AIDS. ? You use needles to inject street drugs. ? You live with someone who has hepatitis B. ? You have had sex with someone who has hepatitis B. ? You get hemodialysis treatment. ? You take certain medicines for conditions, including cancer, organ transplantation, and autoimmune conditions.  Hepatitis C  Blood testing is recommended for: ? Everyone born from 1945 through 1965. ? Anyone with known risk factors for hepatitis C.  Sexually transmitted infections (STIs)  You should be screened for sexually transmitted infections (STIs) including gonorrhea and chlamydia if: ? You are sexually active and are younger than 63 years of age. ? You are older than 63 years of age and your health care provider tells you that you are at risk for this type of infection. ? Your sexual activity has changed since you were last screened and you are at an increased risk for chlamydia or gonorrhea. Ask your health care provider if you are at risk.  If you do not have HIV, but are at risk, it may be recommended that you take a prescription medicine daily to prevent HIV infection. This is called pre-exposure prophylaxis (PrEP). You are considered at risk if: ? You are sexually active and  do not regularly use condoms or know the HIV status of your partner(s). ? You take drugs by injection. ? You are sexually active with a partner who has HIV.  Talk with your health care provider about whether you are at high risk of being infected with HIV. If you choose to begin PrEP, you should first be tested for HIV. You should then be tested every 3 months for as long as you are taking PrEP. Pregnancy  If   you are premenopausal and you may become pregnant, ask your health care provider about preconception counseling.  If you may become pregnant, take 400 to 800 micrograms (mcg) of folic acid every day.  If you want to prevent pregnancy, talk to your health care provider about birth control (contraception). Osteoporosis and menopause  Osteoporosis is a disease in which the bones lose minerals and strength with aging. This can result in serious bone fractures. Your risk for osteoporosis can be identified using a bone density scan.  If you are 23 years of age or older, or if you are at risk for osteoporosis and fractures, ask your health care provider if you should be screened.  Ask your health care provider whether you should take a calcium or vitamin D supplement to lower your risk for osteoporosis.  Menopause may have certain physical symptoms and risks.  Hormone replacement therapy may reduce some of these symptoms and risks. Talk to your health care provider about whether hormone replacement therapy is right for you. Follow these instructions at home:  Schedule regular health, dental, and eye exams.  Stay current with your immunizations.  Do not use any tobacco products including cigarettes, chewing tobacco, or electronic cigarettes.  If you are pregnant, do not drink alcohol.  If you are breastfeeding, limit how much and how often you drink alcohol.  Limit alcohol intake to no more than 1 drink per day for nonpregnant women. One drink equals 12 ounces of beer, 5 ounces of  wine, or 1 ounces of hard liquor.  Do not use street drugs.  Do not share needles.  Ask your health care provider for help if you need support or information about quitting drugs.  Tell your health care provider if you often feel depressed.  Tell your health care provider if you have ever been abused or do not feel safe at home. This information is not intended to replace advice given to you by your health care provider. Make sure you discuss any questions you have with your health care provider. Document Released: 06/05/2011 Document Revised: 04/27/2016 Document Reviewed: 08/24/2015 Elsevier Interactive Patient Education  [employer].

## 2018-02-12 ENCOUNTER — Ambulatory Visit: Payer: BLUE CROSS/BLUE SHIELD | Admitting: Sports Medicine

## 2018-02-12 DIAGNOSIS — M25551 Pain in right hip: Secondary | ICD-10-CM | POA: Diagnosis not present

## 2018-02-12 DIAGNOSIS — M25552 Pain in left hip: Secondary | ICD-10-CM

## 2018-02-12 NOTE — Assessment & Plan Note (Signed)
See note today.  This is consistent with GT pain syndrome This actually may have been triggered by change in sleep pattern and direct pressure over GT in some beds  HEP NSAIDS  Change with memory foam mattress has helped

## 2018-02-12 NOTE — Patient Instructions (Addendum)
Home exercise plan  Demonstrated and handouts given

## 2018-02-12 NOTE — Progress Notes (Signed)
Chief complaint: Bilateral, lateral hip pain times 7-8 months  History of present illness: Natalie [name] is a 62 year old female presents to the sports medicine office today with chief complaint of migratory, bilateral, lateral sided hip pain.  She reports that symptoms have been present for approximately 7-8 months.  She does not report of any specific inciting incident, trauma, or injury to explain the pain.  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  She reports about 3 months ago she started to take on a more physical role in care taking, such as transferring her mother.  She points to the lateral side of both hips over the greater trochanter bursa is where she feels the pain.  She reports that pain is either on the left side or the right side, never together.  She reports pain with going up and down stairs, pain with prolonged walking, as well as laying on each side.  She reports initially she started noticing symptoms at nighttime, but then when she was made aware of this then she started to notice it more during the day.    Pertinent ROS: She is not report of any numbness, tingling, burning paresthesias rating from her back down to her ankle and feet.  She does not report of any back pain.  Initially from symptoms were started she just noticed symptoms of heaviness in her right quadriceps.  That had gone away.  Pain, she has been using Voltaren gel from her mother as well as naproxen.  Reports that this has helped somewhat.  Only, she reports pain is on the left side, describes as a throbbing, aching, 1/10 pain.  Review of systems: systemic As stated above  Interval past medical history, surgical history, family history, and social history obtained and unchanged.  Medical history is notable for anemia, melanoma, history of left radial fracture, dysrhythmia, osteopenia, palpitations, and vitamin D deficiency, surgical history  notable for open reduction internal fixation of left wrist with radial bone graft, breast lumpectomy, tonsillectomy, and wisdom tooth extraction, he does not report of any current tobacco use  Family history notable for breast cancer, hypertension, lung cancer, stroke, alcoholism  Physical exam: Vital signs are reviewed and are documented in the chart Gen.: Alert, oriented, appears stated age, in no apparent distress HEENT: Moist oral mucosa Respiratory: Normal respirations, able to speak in full sentences Cardiac: Regular rate, distal pulses 2+ Integumentary: No rashes on visible skin:  Neurologic: On both sides, she is slightly weak with hip flexion, hip abduction, hamstring strength, would categorize strength equally as 4+/5, she does have great hip extension, gluteal maximus, IT band strength, quadriceps strength, would categorize this strength 5/5, sensation 2+ bilateral lower extremities Psych: Normal affect, mood is described as good Musculoskeletal: Flexion of both hips reveal no obvious deformity or muscle atrophy, no warmth, erythema, ecchymosis, or effusion noted, she is tender to palpation today just slightly to deep palpation over both of the greater trochanter bursa area, no tenderness over either anterior hips or posterior hips, no tenderness over both sides of the gluteus maximus, medius, and minimus, no tenderness over the piriformis muscle, straight leg negative bilaterally, FABE, FADIR, and logroll negative, leg length discrepancy noted, with gait ambulation evaluation she does have a slight right-sided Trendelenburg gait  Assessment and plan: 1.  Lateral greater trochanteric pain syndrome  Plan: Discussed with Natalie [name] today that her symptoms are consistent with bilateral greater trochanteric  pain syndrome.  Discussed more sensitivity to the greater trochanter bone as people age.  Do have a suspicion that the bed padding does play a role into this, especially since symptoms are  improved.  Discussed mainstay of treatment right now is going to be some physical therapy exercises working on hip flexor, hip abduction, hamstring strength.  This was also help control the tendon strain around the piriformis, gluteus medius, gluteus minimus.  Discussed he can take naproxen as needed for pain.  Discussed cryotherapy.  We will have her return in 6 weeks for follow-up and see how she does.   Mort Sawyers, M.D. [facility] [place] observed and examined the patient with the [facility] Fellow and agree with assessment and plan.  Note reviewed and modified by me.  Stefanie Libel, MD

## 2018-02-14 ENCOUNTER — Ambulatory Visit: Payer: BLUE CROSS/BLUE SHIELD | Admitting: Sports Medicine

## 2018-02-15 ENCOUNTER — Other Ambulatory Visit: Payer: Self-pay | Admitting: Internal Medicine

## 2018-02-18 NOTE — Telephone Encounter (Signed)
Pt is requesting for refill on Naproxen 500 mg, this Rx was filled on 01/25/2018 for #40 tablets. Pt takes it twice daily,please Advise if ok to send refills to pt pharmacy.

## 2018-02-18 NOTE — Telephone Encounter (Signed)
Sent in refill

## 2018-02-19 NOTE — Telephone Encounter (Signed)
Pt aware via mychart.  Nothing further needed.

## 2018-02-25 DIAGNOSIS — L570 Actinic keratosis: Secondary | ICD-10-CM | POA: Diagnosis not present

## 2018-03-19 ENCOUNTER — Encounter: Payer: Self-pay | Admitting: Obstetrics & Gynecology

## 2018-03-19 ENCOUNTER — Other Ambulatory Visit: Payer: Self-pay

## 2018-03-19 ENCOUNTER — Other Ambulatory Visit (HOSPITAL_COMMUNITY)
Admission: RE | Admit: 2018-03-19 | Discharge: 2018-03-19 | Disposition: A | Discharge status: Home / Self Care | Payer: BLUE CROSS/BLUE SHIELD | Source: Ambulatory Visit | Attending: Obstetrics & Gynecology | Admitting: Obstetrics & Gynecology

## 2018-03-19 ENCOUNTER — Ambulatory Visit: Payer: BLUE CROSS/BLUE SHIELD | Admitting: Obstetrics & Gynecology

## 2018-03-19 VITALS — BP 116/80 | HR 74 | Resp 16 | Ht 62.25 in | Wt 174.0 lb

## 2018-03-19 DIAGNOSIS — Z01419 Encounter for gynecological examination (general) (routine) without abnormal findings: Secondary | ICD-10-CM | POA: Diagnosis not present

## 2018-03-19 DIAGNOSIS — Z124 Encounter for screening for malignant neoplasm of cervix: Secondary | ICD-10-CM

## 2018-03-19 NOTE — Progress Notes (Signed)
62 y.o. G0P0000 SingleCaucasianF here for annual exam.  Doing well.  Having some hip issues.  Thinks she is having issues due to bed where she was sleeping when taking care of her mother.  Using a memory foam topper.  Pain is better.  She does sometimes wake up at night with bilateral hip pain and even down her right leg.  Has taken some anti-inflammatories.    Mother passed this past year.  Had some dementia.  She fell and had head injury.  Progression was very rapid.  This was in [place], [place].    Has some upcoming travel.  Seeing Dr. Oneida Alar next week for follow-up.    Denies vaginal bleeding.  Patient's last menstrual period was 03/05/2007.          Sexually active: No.  The current method of family planning is post menopausal status.    Exercising: Yes.    hike, walk, elliptical  Smoker:  no  Health Maintenance: Pap:  12/07/14 Neg   08/19/12 Neg. HR HPV:neg  History of abnormal Pap:  no MMG:  12/06/16 BIRADS1:neg.  Aware this is overdue.  Colonoscopy:  01/27/13 Normal. F/u 10 years BMD:   01/26/16 osteopenia  TDaP:  2016 Pneumonia vaccine(s):  n/a Shingrix:   Zostavax 2017 Hep C testing: 02/28/16 Neg  Screening Labs: PCP   reports that she has never smoked. She has never used smokeless tobacco. She reports that she drinks alcohol. She reports that she does not use drugs.  Past Medical History:  Diagnosis Date  . Anemia   . Cervical polyp 2007  . Dysrhythmia    PMH : PVC's as a child only; benign  . Headache    had only with intolerance to medication  . Hx of breast biopsy 2000  . Hx of malignant melanoma 02/28/2013   2003 followed by derm.    . Left radial fracture 06/2015  . Osteopenia    mild  . Palpitations   . Vitamin D deficiency     Past Surgical History:  Procedure Laterality Date  . BREAST LUMPECTOMY  1984   left breast  . COLONOSCOPY    . OPEN REDUCTION INTERNAL FIXATION (ORIF) WRIST WITH RADIAL BONE GRAFT Left 06/19/2015   Procedure: OPEN REDUCTION INTERNAL  FIXATION (ORIF) LEFT WRIST WITH RADIUS AND ULNA WITH ALLOGRAFT BONE GRAFT;  Surgeon: Roseanne Kaufman, MD;  Location: [facility];  Service: Orthopedics;  Laterality: Left;  . TONSILLECTOMY  1960  . WISDOM TOOTH EXTRACTION      Current Outpatient Medications  Medication Sig Dispense Refill  . Ascorbic Acid (VITAMIN C) 1000 MG tablet Take 1,000 mg by mouth daily.    . calcium carbonate (TUMS EX) 750 MG chewable tablet Chew 2 tablets by mouth daily.    . Multiple Vitamin (MULTIVITAMIN WITH MINERALS) TABS tablet Take 1 tablet by mouth daily.    Marland Kitchen OVER THE COUNTER MEDICATION Place 1 drop into both eyes 2 (two) times daily as needed (dry eyes). Over the counter saline eye drops    . OVER THE COUNTER MEDICATION Take 1 tablet by mouth every 3 (three) hours as needed (cold symptoms (and 48 hours after cold symptoms subside)). Sambucol (OTC)    . Vitamin D, Ergocalciferol, (DRISDOL) 50000 units CAPS capsule TAKE ONE CAPSULE BY MOUTH ONCE A WEEK 12 capsule 2   No current facility-administered medications for this visit.     Family History  Problem Relation Age of Onset  . Hypertension Paternal Uncle   . Lung  cancer Unknown   . Stroke Unknown   . Other Unknown        mom viral meningitis  . Osteoarthritis Unknown   . Alcoholism Unknown   . Breast cancer Paternal Aunt   . Breast cancer Other        paternal great aunt    Review of Systems  All other systems reviewed and are negative.   Exam:   BP 116/80 (BP Location: Left Arm, Patient Position: Sitting, Cuff Size: Large)   Pulse 74   Resp 16   Ht 5' 2.25" (1.581 m)   Wt 174 lb (78.9 kg)   LMP 03/05/2007   BMI 31.57 kg/m    Height: 5' 2.25" (158.1 cm)  Ht Readings from Last 3 Encounters:  03/19/18 5' 2.25" (1.581 m)  02/12/18 5\' 1"  (1.549 m)  02/01/18 5' 1.81" (1.57 m)    General appearance: alert, cooperative and appears stated age Head: Normocephalic, without obvious abnormality, atraumatic Neck: no adenopathy, supple, symmetrical,  trachea midline and thyroid normal to inspection and palpation Lungs: clear to auscultation bilaterally Breasts: normal appearance, no masses or tenderness Heart: regular rate and rhythm Abdomen: soft, non-tender; bowel sounds normal; no masses,  no organomegaly Extremities: extremities normal, atraumatic, no cyanosis or edema Skin: Skin color, texture, turgor normal. No rashes or lesions Lymph nodes: Cervical, supraclavicular, and axillary nodes normal. No abnormal inguinal nodes palpated Neurologic: Grossly normal   Pelvic: External genitalia:  no lesions              Urethra:  normal appearing urethra with no masses, tenderness or lesions              Bartholins and Skenes: normal                 Vagina: normal appearing vagina with normal color and discharge, no lesions              Cervix: no lesions              Pap taken: Yes.   Bimanual Exam:  Uterus:  normal size, contour, position, consistency, mobility, non-tender              Adnexa: normal adnexa and no mass, fullness, tenderness               Rectovaginal: Confirms               Anus:  normal sphincter tone, no lesions  Chaperone was present for exam.  A:  Well Woman with normal exam PMP, no HRT H/o melanoma.  Seeing derm yearly. H/O Vit D deficiency Osteopenia Hip pain  P:   Mammogram guidelines reviewed.  Pt aware this is overdue. pap smear and HR HPV obtained today Lab work is UTD D/w pt Shingrix vaccine today.  Pt is going to try and get this. Plan to repeat BMD with [facility].  Order [facility] be [facility] to [facility]. Consider referral to Dr. Lynann Bologna.  Pt will see Dr. Oneida Alar next week and let me know if needs referral. Return annually or prn

## 2018-03-19 NOTE — Patient Instructions (Addendum)
Dr. Almedia Balls.  Raliegh Ip.

## 2018-03-20 LAB — CYTOLOGY - PAP
DIAGNOSIS: NEGATIVE
HPV: NOT DETECTED

## 2018-03-26 ENCOUNTER — Ambulatory Visit: Payer: BLUE CROSS/BLUE SHIELD | Admitting: Sports Medicine

## 2018-03-26 ENCOUNTER — Encounter: Payer: Self-pay | Admitting: Sports Medicine

## 2018-03-26 VITALS — BP 106/78 | Ht 62.5 in | Wt 168.0 lb

## 2018-03-26 DIAGNOSIS — M25559 Pain in unspecified hip: Secondary | ICD-10-CM

## 2018-03-26 NOTE — Progress Notes (Signed)
Chief complaint: Follow-up of bilateral hip pain times 8-9 months  History of present illness: Natalie [name] is a 62 year old female who presents to sports medicine office today for follow-up of bilateral hip pain.  Symptoms have now been present for approximately 8-9 months.  In brief review, she was seen here for initial presentation of symptoms back on 02/12/18.  At that time, she reports that she had gradual onset of bilateral hip pain.  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  At that time, she was diagnosed with greater trochanteric pain syndrome, with increased sensitivity to the greater trochanter.  She was advised to do home exercise program working on hip flexor, hip abduction, hamstring strength.  She does report today of having general improvement in her symptoms, but still reports of having vague, migratory pain in the bilateral hips and IT bands.  She points in the general area of the greater trochanteric bursal region as to where she feels the pain.  She does not report of any specific inciting incident or any movement that she makes that can cause the pain.  She reports prolonged activities such as walking does aggravate symptoms but is in a vague and non-predictable pattern.  She reports that which ever sided effects it does alter her gait and she does favor that specific side.  She does not report of any numbness, tingling, or burning paresthesias.  She has been using intermittent naproxen for pain, which is not really been helping so much.  She also reports with doing a crossover step ups film some pain in the medial aspect of her right knee.  She reports when she lays down at night she occasionally feels tightness in her back.  She reports that she has aspirations to do hiking this summer, but does not feel that she can fully enjoy it with these current conditions.  Review of systems:  As stated above  Interval past medical history,  surgical history, family history, and social history obtained and unchanged. Her past medical history is notable for anemia, melanoma, history of left radial fracture, dysrhythmia, osteopenia, palpitations, and vitamin D deficiency; surgical history notable for open reduction internal fixation of left wrist with radial bone graft, breast lumpectomy, tonsillectomy, and wisdom tooth extraction; she does not report of any current tobacco use; family history notable for breast cancer, hypertension, lung cancer, stroke, alcoholism; allergies and medications are reviewed and are reflected in EMR.  Physical exam: Vital signs are reviewed and are documented in the chart Gen.: Alert, oriented, appears stated age, in no apparent distress HEENT: Moist oral mucosa Respiratory: Normal respirations, able to speak in full sentences Cardiac: Regular rate, distal pulses 2+ Integumentary: No rashes on visible skin:  Neurologic: Strength is much improved today compared to last time, has very slight weakness with hip flexion on left side, but not enough to call it a 4+/5, would essentially say her strength with hip flexion, hip abduction, quadriceps, and hamstring strength is 5/5, sensation 2+ in bilateral lower extremities Psych: Normal affect, mood is described as good Musculoskeletal: Inspection of bilateral hips reveals no obvious deformity or muscle atrophy, no warmth, erythema, ecchymosis, or effusion, she is only slightly tender to deep palpation near the PSIS on the left side, no TTP over the anterior hips, lateral hips, or posterior hips, no TTP over the lumbar spine or SI joints bilaterally, straight leg, FABER, FADIR, and logroll negative  Assessment and plan: 1.  Bilateral greater trochanteric pain syndrome, with symptom improvement 2. Relative sarcopenia  Plan: Encouraged Natalie [name] that she is making progress with increased gains in strength in the hip and core muscle, along with the quadriceps and hamstrings.  Discussed notion of relative sarcopenia as women age and decreased tensile strength in muscles and increased recovery time. Suspect this has been a general downward trend for her as she passed menopause, but now she is becoming symptomatic from this. Reviewed recent labwork from Feb 2019, had normal CBC, CMP, and TSH. Discussed use of antioxidants, such as tart cherry juice, kale, and other dark leafy greens. She opts to avoid medications, but discussed that a muscle relaxant such as Flexeril could be an option if the above measures don't help. Also discussed option of formal physical therapy if she decides to pursue this avenue at this time. Discussed will have her follow up in 6 weeks if no interval improvement or sooner as needed.   Mort Sawyers, M.D. [facility]  I observed and examined the patient with the resident and agree with assessment and plan.  Note reviewed and modified by me. Stefanie Libel, MD

## 2018-03-26 NOTE — Patient Instructions (Signed)
Try some tart cherry juice, kale, beets, collards, broccoli and asparagus. You can blend this into a smoothie.   See [place] in 6 weeks if you're not getting better. Let us know if you'd like [place] to set you up with some formal physical therapy.

## 2018-04-02 DIAGNOSIS — L814 Other melanin hyperpigmentation: Secondary | ICD-10-CM | POA: Diagnosis not present

## 2018-04-02 DIAGNOSIS — D1801 Hemangioma of skin and subcutaneous tissue: Secondary | ICD-10-CM | POA: Diagnosis not present

## 2018-04-02 DIAGNOSIS — L821 Other seborrheic keratosis: Secondary | ICD-10-CM | POA: Diagnosis not present

## 2018-04-02 DIAGNOSIS — D225 Melanocytic nevi of trunk: Secondary | ICD-10-CM | POA: Diagnosis not present

## 2018-04-08 ENCOUNTER — Other Ambulatory Visit: Payer: Self-pay | Admitting: Obstetrics & Gynecology

## 2018-04-08 DIAGNOSIS — Z1231 Encounter for screening mammogram for malignant neoplasm of breast: Secondary | ICD-10-CM | POA: Diagnosis not present

## 2018-04-08 DIAGNOSIS — Z803 Family history of malignant neoplasm of breast: Secondary | ICD-10-CM | POA: Diagnosis not present

## 2018-04-08 DIAGNOSIS — M25552 Pain in left hip: Secondary | ICD-10-CM

## 2018-04-08 DIAGNOSIS — M8589 Other specified disorders of bone density and structure, multiple sites: Secondary | ICD-10-CM | POA: Diagnosis not present

## 2018-04-25 DIAGNOSIS — S139XXA Sprain of joints and ligaments of unspecified parts of neck, initial encounter: Secondary | ICD-10-CM | POA: Diagnosis not present

## 2018-04-25 DIAGNOSIS — M48061 Spinal stenosis, lumbar region without neurogenic claudication: Secondary | ICD-10-CM | POA: Diagnosis not present

## 2018-04-26 ENCOUNTER — Telehealth: Payer: Self-pay

## 2018-04-26 ENCOUNTER — Other Ambulatory Visit: Payer: Self-pay | Admitting: Orthopedic Surgery

## 2018-04-26 DIAGNOSIS — M545 Low back pain: Secondary | ICD-10-CM

## 2018-04-26 NOTE — Telephone Encounter (Signed)
Spoke with patient and notified of stable BMD per Dr.Miller. Still with osteopenia but no osteoporosis. Repeat study in 3 years.

## 2018-05-07 ENCOUNTER — Ambulatory Visit
Admission: RE | Admit: 2018-05-07 | Discharge: 2018-05-07 | Disposition: A | Discharge status: Home / Self Care | Payer: BLUE CROSS/BLUE SHIELD | Source: Ambulatory Visit | Attending: Orthopedic Surgery | Admitting: Orthopedic Surgery

## 2018-05-07 ENCOUNTER — Ambulatory Visit: Payer: BLUE CROSS/BLUE SHIELD | Admitting: Sports Medicine

## 2018-05-07 ENCOUNTER — Other Ambulatory Visit: Payer: BLUE CROSS/BLUE SHIELD

## 2018-05-07 DIAGNOSIS — M545 Low back pain: Secondary | ICD-10-CM

## 2018-05-07 DIAGNOSIS — M48061 Spinal stenosis, lumbar region without neurogenic claudication: Secondary | ICD-10-CM | POA: Diagnosis not present

## 2018-05-20 ENCOUNTER — Encounter: Payer: Self-pay | Admitting: Obstetrics & Gynecology

## 2018-08-16 ENCOUNTER — Ambulatory Visit: Payer: BLUE CROSS/BLUE SHIELD | Admitting: Obstetrics & Gynecology

## 2018-08-16 ENCOUNTER — Encounter

## 2018-09-02 DIAGNOSIS — Z23 Encounter for immunization: Secondary | ICD-10-CM | POA: Diagnosis not present

## 2018-09-30 ENCOUNTER — Other Ambulatory Visit: Payer: Self-pay | Admitting: Obstetrics & Gynecology

## 2018-09-30 NOTE — Telephone Encounter (Signed)
Medication refill request: vit D  Last AEX:  03/19/18 SM  Next AEX: 07/15/19  Last MMG (if hormonal medication request): 04/08/18 BIRADS1:Neg  Refill authorized: 01/16/18 #12/3R.

## 2018-10-08 ENCOUNTER — Other Ambulatory Visit: Payer: Self-pay

## 2018-10-08 MED ORDER — VITAMIN D (ERGOCALCIFEROL) 1.25 MG (50000 UNIT) PO CAPS: 50000.0000 [IU] | ORAL_CAPSULE | ORAL | 1 refills | Status: DC

## 2018-10-08 NOTE — Telephone Encounter (Signed)
Medication refill request: Vitamin D2 1.25mg   Last AEX:  03/19/18  Next AEX: 07/15/19 Last MMG (if hormonal medication request):04/08/18 Bi-rads 1 neg   Refill authorized:

## 2018-10-08 NOTE — Telephone Encounter (Signed)
Medication refill request: Vitamin D2 1.25mg   Last AEX:  03/19/18  Next AEX: 07/15/19 Last MMG (if hormonal medication request):04/08/18 Bi-rads 1 neg   Refill authorized: 12 with 1RF

## 2018-11-11 DIAGNOSIS — Z23 Encounter for immunization: Secondary | ICD-10-CM | POA: Diagnosis not present

## 2018-12-05 ENCOUNTER — Telehealth: Payer: Self-pay | Admitting: *Deleted

## 2018-12-05 NOTE — Telephone Encounter (Signed)
Patient is going to [place] and wondering if Dr Sabra Heck would prescribe malaria prevention medication.

## 2018-12-05 NOTE — Telephone Encounter (Signed)
Spoke with patient. Patient is requesting Rx for antimalarial medication. Will be traveling to [place] 1/22 -2/2. Pharmacy verified. Advised I would have to review request with Dr. Sabra Heck and return call. Patient agreeable.   Routing to Dr. Sabra Heck to advise.

## 2018-12-05 NOTE — Telephone Encounter (Signed)
Left message to call [name], RN at [facility] [phone].   

## 2018-12-06 MED ORDER — MEFLOQUINE HCL 250 MG PO TABS: 250.0000 mg | ORAL_TABLET | ORAL | 0 refills | Status: DC

## 2018-12-06 NOTE — Telephone Encounter (Signed)
Left detailed message, ok per dpr. Advised as seen below per Dr. Sabra Heck. Return call to office if any additional questions.   Encounter closed.

## 2018-12-06 NOTE — Telephone Encounter (Signed)
Please let pt know rx for mefloquine has been completed.  I reviewed CDC recommendations for [place] and this is one of the recommended medications.  Please encourage her to read the side effects of medication when picks it up just to be familiar with them.  Thanks.

## 2018-12-10 ENCOUNTER — Telehealth: Payer: Self-pay | Admitting: Obstetrics & Gynecology

## 2018-12-10 MED ORDER — AZITHROMYCIN 500 MG PO TABS: 500.0000 mg | ORAL_TABLET | Freq: Every day | ORAL | 0 refills | Status: AC

## 2018-12-10 NOTE — Telephone Encounter (Signed)
Routing to Dr. Miller

## 2018-12-10 NOTE — Telephone Encounter (Signed)
Patient is traveling and said she forgot to ask for a prescription for traveler's diarrhea. She is leaving on a vacation tomorrow. Pharmacy on file confirmed.

## 2018-12-10 NOTE — Telephone Encounter (Signed)
Rx sent. Patient notified.  Encounter closed.  Instructions given for use as needed.

## 2018-12-10 NOTE — Telephone Encounter (Signed)
Ok to send in rx for azithromycin 500mg .  1 po daily x 3 days for travelers diarrhea.  #6/0RF.

## 2019-01-17 ENCOUNTER — Ambulatory Visit: Payer: BLUE CROSS/BLUE SHIELD | Admitting: Internal Medicine

## 2019-01-17 ENCOUNTER — Encounter: Payer: Self-pay | Admitting: Internal Medicine

## 2019-01-17 ENCOUNTER — Other Ambulatory Visit: Payer: Self-pay

## 2019-01-17 VITALS — BP 124/78 | HR 75 | Temp 97.8°F | Wt 176.6 lb

## 2019-01-17 DIAGNOSIS — H9192 Unspecified hearing loss, left ear: Secondary | ICD-10-CM

## 2019-01-17 DIAGNOSIS — J988 Other specified respiratory disorders: Secondary | ICD-10-CM

## 2019-01-17 DIAGNOSIS — H6982 Other specified disorders of Eustachian tube, left ear: Secondary | ICD-10-CM

## 2019-01-17 NOTE — Progress Notes (Signed)
Chief Complaint  Patient presents with  . Ear Pain    Pt feels like she has fluid in ears from having a cold she feels like she can't kick. pt states sh was in [place] and got back on Feb. 2nd.    HPI: Natalie [name] 63 y.o. come in for problem based visit   Cold  In [place] about January 26 with some upper respiratory congestion and an irritative cough that is significantly improved but still has a cough occasional.  She is having some clogged feeling fluid in your ears mostly left off and on without pain or severe hearing loss or sinus pain It was waxing and waning the sleep week comes in just wants to make sure she does not have risk of losing her hearing or other intervention needed.  There is no dizziness or pain.  Flight back to [place] from [place] wasFeb 2  Had  Sx jan 26   When  [place]  Had some ear sx  . Then went away . Off and on sx some nasal crusign no sinu pain took mucinex ? hleped some  ROS: See pertinent positives and negatives per HPI.  No current fever shortness of breath sinus pain severe hearing loss dizziness.  No plans to travel on a plane in the near future.  Past Medical History:  Diagnosis Date  . Anemia   . Cervical polyp 2007  . Dysrhythmia    PMH : PVC's as a child only; benign  . Headache    had only with intolerance to medication  . Hx of breast biopsy 2000  . Hx of malignant melanoma 02/28/2013   2003 followed by derm.    . Left radial fracture 06/2015  . Osteopenia    mild  . Palpitations   . Vitamin D deficiency     Family History  Problem Relation Age of Onset  . Hypertension Paternal Uncle   . Lung cancer Unknown   . Stroke Unknown   . Other Unknown        mom viral meningitis  . Osteoarthritis Unknown   . Alcoholism Unknown   . Breast cancer Paternal Aunt   . Breast cancer Other        paternal great aunt    Social History   Socioeconomic History  . Marital status: Single    Spouse name: Not on file  . Number of children: Not  on file  . Years of education: Not on file  . Highest education level: Not on file  Occupational History  . Not on file  Social Needs  . Financial resource strain: Not on file  . Food insecurity:    Worry: Not on file    Inability: Not on file  . Transportation needs:    Medical: Not on file    Non-medical: Not on file  Tobacco Use  . Smoking status: Never Smoker  . Smokeless tobacco: Never Used  Substance and Sexual Activity  . Alcohol use: Yes    Alcohol/week: 0.0 - 2.0 standard drinks    Comment: occ  . Drug use: No  . Sexual activity: Not Currently    Birth control/protection: Post-menopausal  Lifestyle  . Physical activity:    Days per week: Not on file    Minutes per session: Not on file  . Stress: Not on file  Relationships  . Social connections:    Talks on phone: Not on file    Gets together: Not on file  Attends religious service: Not on file    Active member of club or organization: Not on file    Attends meetings of clubs or organizations: Not on file    Relationship status: Not on file  Other Topics Concern  . Not on file  Social History Narrative   hh of 2    Neg td    PHD sports and exercise science    Works as Physical therapist    Has been PE teacher     Outpatient Medications Prior to Visit  Medication Sig Dispense Refill  . Ascorbic Acid (VITAMIN C) 1000 MG tablet Take 1,000 mg by mouth daily.    . calcium carbonate (TUMS EX) 750 MG chewable tablet Chew 2 tablets by mouth daily.    . mefloquine (LARIAM) 250 MG tablet Take 1 tablet (250 mg total) by mouth every 7 (seven) days. Begin two weeks before trip and take for four weeks after returning. 8 tablet 0  . Multiple Vitamin (MULTIVITAMIN WITH MINERALS) TABS tablet Take 1 tablet by mouth daily.    Marland Kitchen OVER THE COUNTER MEDICATION Place 1 drop into both eyes 2 (two) times daily as needed (dry eyes). Over the counter saline eye drops    . Vitamin D, Ergocalciferol, (DRISDOL) 50000 units CAPS capsule  Take 1 capsule (50,000 Units total) by mouth once a week. 12 capsule 1  . OVER THE COUNTER MEDICATION Take 1 tablet by mouth every 3 (three) hours as needed (cold symptoms (and 48 hours after cold symptoms subside)). Sambucol (OTC)     No facility-administered medications prior to visit.      EXAM:  BP 124/78 (BP Location: Right Arm, Patient Position: Sitting, Cuff Size: Normal)   Pulse 75   Temp 97.8 F (36.6 C) (Oral)   Wt 176 lb 9.6 oz (80.1 kg)   LMP 03/05/2007   SpO2 97%   BMI 31.79 kg/m   Body mass index is 31.79 kg/m.  GENERAL: vitals reviewed and listed above, alert, oriented, appears well hydrated and in no acute distress HEENT: atraumatic, conjunctiva  clear, no obvious abnormalities on inspection of external nose and ears nose slightly congested face nontender.  TMs have normal landmarks left has a light reflex slightly muted but no fluid level bulging redness.  Bony landmarks are normal OP : no lesion edema or exudate  NECK: no obvious masses on inspection palpation  LUNGS: clear to auscultation bilaterally, no wheezes, rales or rhonchi, good air movement CV: HRRR, no clubbing cyanosis or  peripheral edema nl cap refill  MS: moves all extremities without noticeable focal  abnormality   BP Readings from Last 3 Encounters:  01/17/19 124/78  03/26/18 106/78  03/19/18 116/80    ASSESSMENT AND PLAN:  Discussed the following assessment and plan:  Dysfunction of left eustachian tube  Hearing-related symptom of left ear  Congestion of respiratory tract Suspecting eustachian tube dysfunction with a viral respiratory infection that is resolving.  No obvious bacterial sinus going on however situation  may have been aggravated by barometric pressure changes when she was on the airplane.  At this time we will continue her saline, she can consider decongestants add Flonase at least a week and if in the next 3 to 4 weeks persistent progressive can do more evaluation such as  hearing screen etc.  At this time no evidence of bacterial sinus infection. Reviewed anatomy. Expectant management.  -Patient advised to return or notify health care team  if  new concerns arise.  Patient Instructions  This s probably  eustachian    Tube dysfunction  You rear exams no infection.  Chest is good .  On exam  Stay on the saline  Consider   addiing flonase spray every day for at elast 1 week   Decongestants  Can help for comfort   If  persistent or progressive another 3-4 weeks  Can do recheck.  Or if sever pain fever  Etec.    Eustachian Tube Dysfunction  Eustachian tube dysfunction refers to a condition in which a blockage develops in the narrow passage that connects the middle ear to the back of the nose (eustachian tube). The eustachian tube regulates air pressure in the middle ear by letting air move between the ear and nose. It also helps to drain fluid from the middle ear space. Eustachian tube dysfunction can affect one or both ears. When the eustachian tube does not function properly, air pressure, fluid, or both can build up in the middle ear. What are the causes? This condition occurs when the eustachian tube becomes blocked or cannot open normally. Common causes of this condition include:  Ear infections.  Colds and other infections that affect the nose, mouth, and throat (upper respiratory tract).  Allergies.  Irritation from cigarette smoke.  Irritation from stomach acid coming up into the esophagus (gastroesophageal reflux). The esophagus is the tube that carries food from the mouth to the stomach.  Sudden changes in air pressure, such as from descending in an airplane or scuba diving.  Abnormal growths in the nose or throat, such as: ? Growths that line the nose (nasal polyps). ? Abnormal growth of cells (tumors). ? Enlarged tissue at the back of the throat (adenoids). What increases the risk? You are more likely to develop this condition  if:  You smoke.  You are overweight.  You are a child who has: ? Certain birth defects of the mouth, such as cleft palate. ? Large tonsils or adenoids. What are the signs or symptoms? Common symptoms of this condition include:  A feeling of fullness in the ear.  Ear pain.  Clicking or popping noises in the ear.  Ringing in the ear.  Hearing loss.  Loss of balance.  Dizziness. Symptoms may get worse when the air pressure around you changes, such as when you travel to an area of high elevation, fly on an airplane, or go scuba diving. How is this diagnosed? This condition may be diagnosed based on:  Your symptoms.  A physical exam of your ears, nose, and throat.  Tests, such as those that measure: ? The movement of your eardrum (tympanogram). ? Your hearing (audiometry). How is this treated? Treatment depends on the cause and severity of your condition.  In mild cases, you may relieve your symptoms by moving air into your ears. This is called "popping the ears."  In more severe cases, or if you have symptoms of fluid in your ears, treatment may include: ? Medicines to relieve congestion (decongestants). ? Medicines that treat allergies (antihistamines). ? Nasal sprays or ear drops that contain medicines that reduce swelling (steroids). ? A procedure to drain the fluid in your eardrum (myringotomy). In this procedure, a small tube is placed in the eardrum to:  Drain the fluid.  Restore the air in the middle ear space. ? A procedure to insert a balloon device through the nose to inflate the opening of the eustachian tube (balloon dilation). Follow these instructions at home: Lifestyle  Do not  do any of the following until your health care provider approves: ? Travel to high altitudes. ? Fly in airplanes. ? Work in a Pension scheme manager or room. ? Scuba dive.  Do not use any products that contain nicotine or tobacco, such as cigarettes and e-cigarettes. If you need  help quitting, ask your health care provider.  Keep your ears dry. Wear fitted earplugs during showering and bathing. Dry your ears completely after. General instructions  Take over-the-counter and prescription medicines only as told by your health care provider.  Use techniques to help pop your ears as recommended by your health care provider. These may include: ? Chewing gum. ? Yawning. ? Frequent, forceful swallowing. ? Closing your mouth, holding your nose closed, and gently blowing as if you are trying to blow air out of your nose.  Keep all follow-up visits as told by your health care provider. This is important. Contact a health care provider if:  Your symptoms do not go away after treatment.  Your symptoms come back after treatment.  You are unable to pop your ears.  You have: ? A fever. ? Pain in your ear. ? Pain in your head or neck. ? Fluid draining from your ear.  Your hearing suddenly changes.  You become very dizzy.  You lose your balance. Summary  Eustachian tube dysfunction refers to a condition in which a blockage develops in the eustachian tube.  It can be caused by ear infections, allergies, inhaled irritants, or abnormal growths in the nose or throat.  Symptoms include ear pain, hearing loss, or ringing in the ears.  Mild cases are treated with maneuvers to unblock the ears, such as yawning or ear popping.  Severe cases are treated with medicines. Surgery may also be done (rare). This information is not intended to replace advice given to you by your health care provider. Make sure you discuss any questions you have with your health care provider. Document Released: 12/17/2015 Document Revised: 03/12/2018 Document Reviewed: 03/12/2018 Elsevier Interactive Patient Education  2019 [place] K.  M.D.

## 2019-01-17 NOTE — Patient Instructions (Addendum)
This s probably  eustachian    Tube dysfunction  You rear exams no infection.  Chest is good .  On exam  Stay on the saline  Consider   addiing flonase spray every day for at elast 1 week   Decongestants  Can help for comfort   If  persistent or progressive another 3-4 weeks  Can do recheck.  Or if sever pain fever  Etec.    Eustachian Tube Dysfunction  Eustachian tube dysfunction refers to a condition in which a blockage develops in the narrow passage that connects the middle ear to the back of the nose (eustachian tube). The eustachian tube regulates air pressure in the middle ear by letting air move between the ear and nose. It also helps to drain fluid from the middle ear space. Eustachian tube dysfunction can affect one or both ears. When the eustachian tube does not function properly, air pressure, fluid, or both can build up in the middle ear. What are the causes? This condition occurs when the eustachian tube becomes blocked or cannot open normally. Common causes of this condition include:  Ear infections.  Colds and other infections that affect the nose, mouth, and throat (upper respiratory tract).  Allergies.  Irritation from cigarette smoke.  Irritation from stomach acid coming up into the esophagus (gastroesophageal reflux). The esophagus is the tube that carries food from the mouth to the stomach.  Sudden changes in air pressure, such as from descending in an airplane or scuba diving.  Abnormal growths in the nose or throat, such as: ? Growths that line the nose (nasal polyps). ? Abnormal growth of cells (tumors). ? Enlarged tissue at the back of the throat (adenoids). What increases the risk? You are more likely to develop this condition if:  You smoke.  You are overweight.  You are a child who has: ? Certain birth defects of the mouth, such as cleft palate. ? Large tonsils or adenoids. What are the signs or symptoms? Common symptoms of this condition  include:  A feeling of fullness in the ear.  Ear pain.  Clicking or popping noises in the ear.  Ringing in the ear.  Hearing loss.  Loss of balance.  Dizziness. Symptoms may get worse when the air pressure around you changes, such as when you travel to an area of high elevation, fly on an airplane, or go scuba diving. How is this diagnosed? This condition may be diagnosed based on:  Your symptoms.  A physical exam of your ears, nose, and throat.  Tests, such as those that measure: ? The movement of your eardrum (tympanogram). ? Your hearing (audiometry). How is this treated? Treatment depends on the cause and severity of your condition.  In mild cases, you may relieve your symptoms by moving air into your ears. This is called "popping the ears."  In more severe cases, or if you have symptoms of fluid in your ears, treatment may include: ? Medicines to relieve congestion (decongestants). ? Medicines that treat allergies (antihistamines). ? Nasal sprays or ear drops that contain medicines that reduce swelling (steroids). ? A procedure to drain the fluid in your eardrum (myringotomy). In this procedure, a small tube is placed in the eardrum to:  Drain the fluid.  Restore the air in the middle ear space. ? A procedure to insert a balloon device through the nose to inflate the opening of the eustachian tube (balloon dilation). Follow these instructions at home: Lifestyle  Do not do any of  the following until your health care provider approves: ? Travel to high altitudes. ? Fly in airplanes. ? Work in a Pension scheme manager or room. ? Scuba dive.  Do not use any products that contain nicotine or tobacco, such as cigarettes and e-cigarettes. If you need help quitting, ask your health care provider.  Keep your ears dry. Wear fitted earplugs during showering and bathing. Dry your ears completely after. General instructions  Take over-the-counter and prescription medicines  only as told by your health care provider.  Use techniques to help pop your ears as recommended by your health care provider. These may include: ? Chewing gum. ? Yawning. ? Frequent, forceful swallowing. ? Closing your mouth, holding your nose closed, and gently blowing as if you are trying to blow air out of your nose.  Keep all follow-up visits as told by your health care provider. This is important. Contact a health care provider if:  Your symptoms do not go away after treatment.  Your symptoms come back after treatment.  You are unable to pop your ears.  You have: ? A fever. ? Pain in your ear. ? Pain in your head or neck. ? Fluid draining from your ear.  Your hearing suddenly changes.  You become very dizzy.  You lose your balance. Summary  Eustachian tube dysfunction refers to a condition in which a blockage develops in the eustachian tube.  It can be caused by ear infections, allergies, inhaled irritants, or abnormal growths in the nose or throat.  Symptoms include ear pain, hearing loss, or ringing in the ears.  Mild cases are treated with maneuvers to unblock the ears, such as yawning or ear popping.  Severe cases are treated with medicines. Surgery may also be done (rare). This information is not intended to replace advice given to you by your health care provider. Make sure you discuss any questions you have with your health care provider. Document Released: 12/17/2015 Document Revised: 03/12/2018 Document Reviewed: 03/12/2018 Elsevier Interactive Patient Education  2019 [employer].

## 2019-04-06 IMAGING — MR MR LUMBAR SPINE W/O CM
4 of 5 series · 26 of 48 positions shown · non-contrast
Comparison: No priors.

CLINICAL DATA: Low back pain with BILATERAL leg weakness and
numbness, more on the RIGHT while sleeping.

EXAM:
MRI LUMBAR SPINE WITHOUT CONTRAST
TECHNIQUE: Multiplanar, multisequence MR imaging of the lumbar spine was
performed. No intravenous contrast was administered.

[Series 2: T2 · sagittal · 4.0mm · 0.55mm/px · 4 of 13 slices shown (1 of 2)]
[im 1/13]
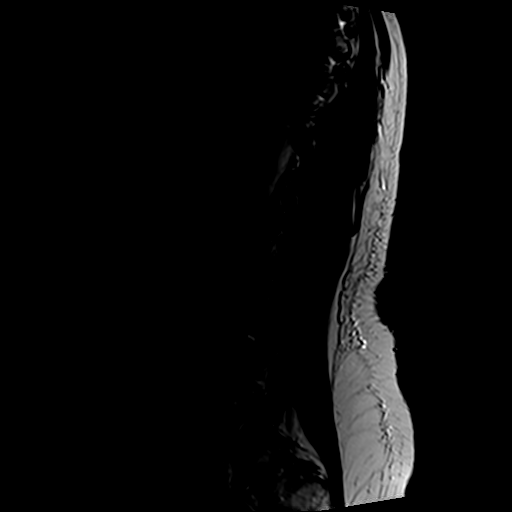
[im 5/13]
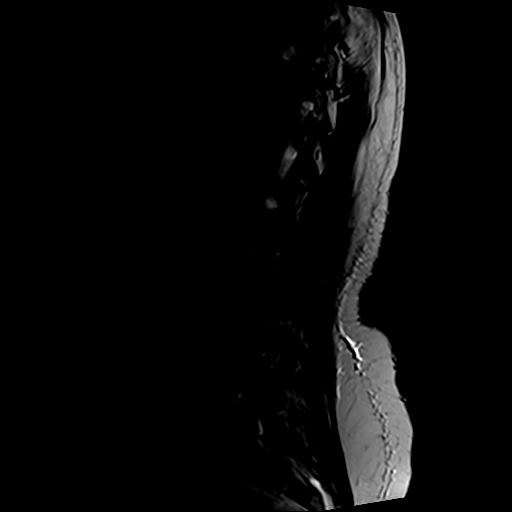
[im 9/13]
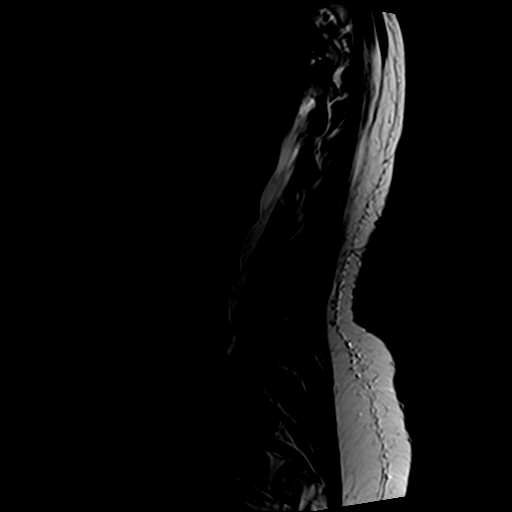
[im 13/13]
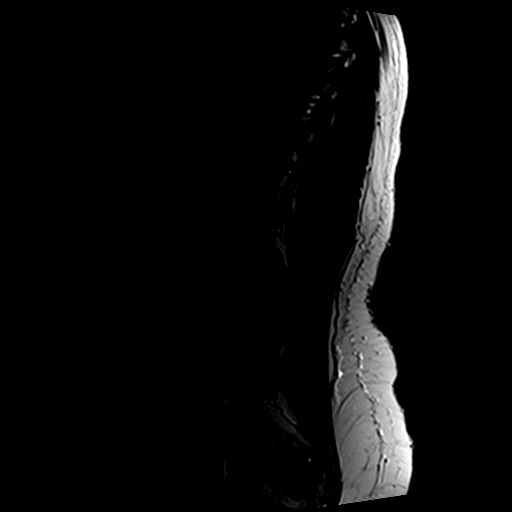

[Series 4: T1 · sagittal · 4.0mm · 0.55mm/px · 5 of 13 slices shown (1 of 2)]
[im 1/13]
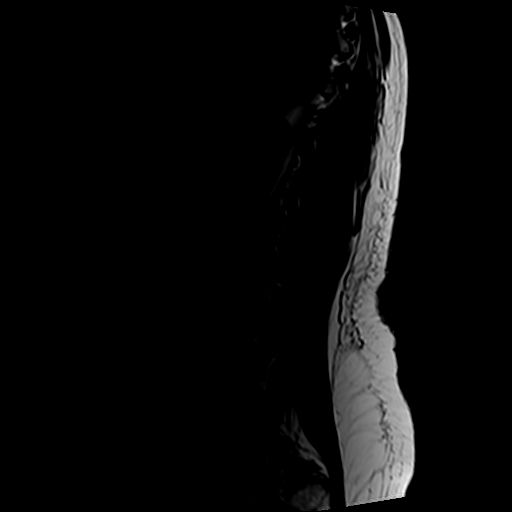
[im 4/13]
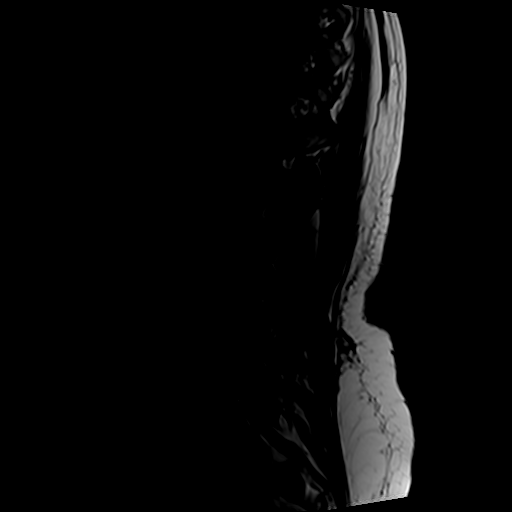
[im 7/13]
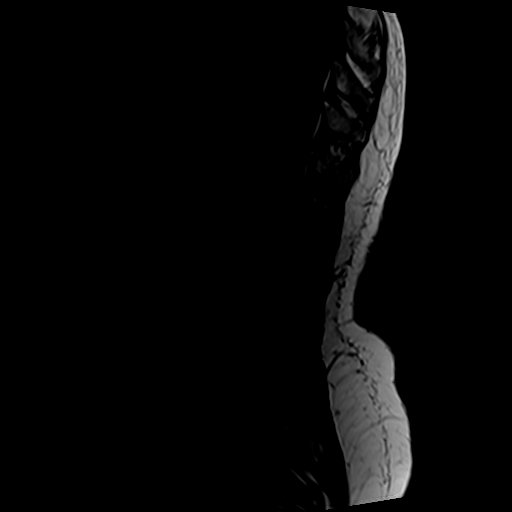
[im 10/13]
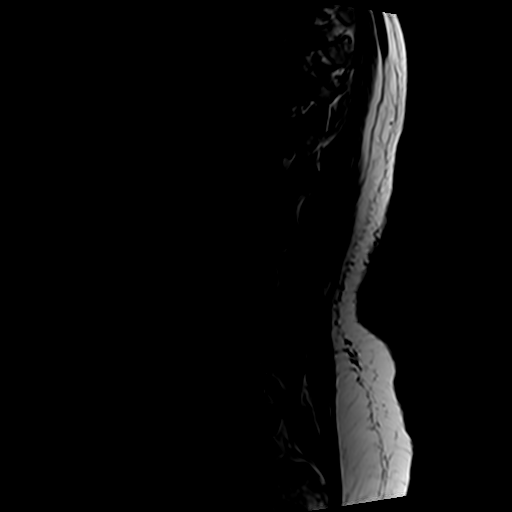
[im 13/13]
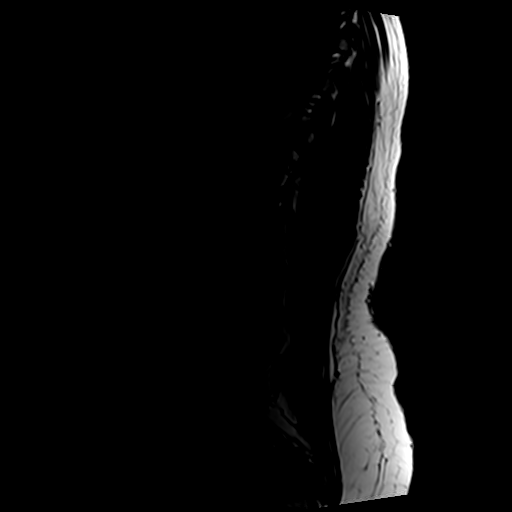

[Series 5: T2 · axial · 4.0mm · 0.70mm/px · z∈[-117,+88]mm · 11 of 44 slices shown (2 of 2)]
[im 3/44]
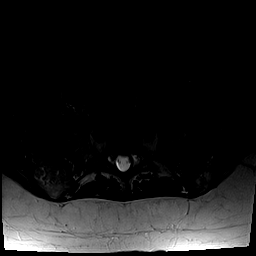
[im 6/44]
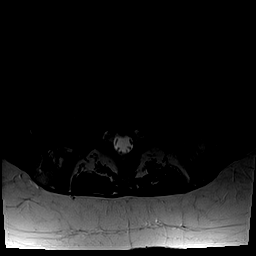
[im 9/44]
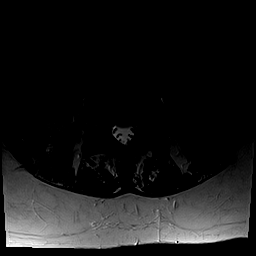
[im 14/44]
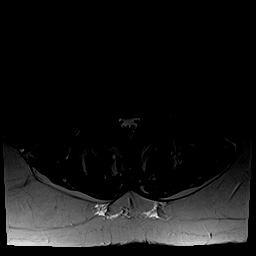
[im 19/44]
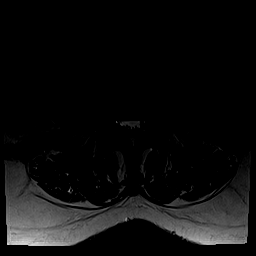
[im 22/44]
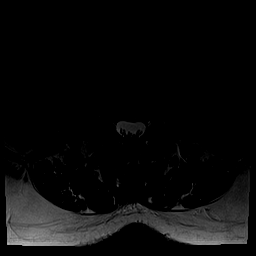
[im 25/44]
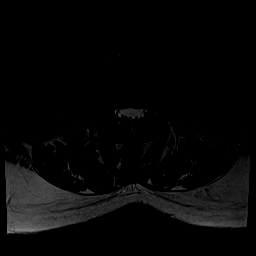
[im 30/44]
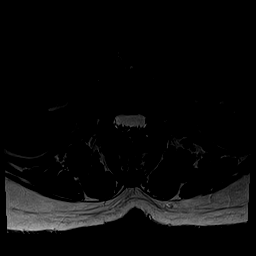
[im 35/44]
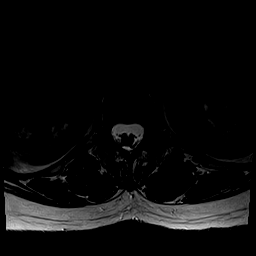
[im 38/44]
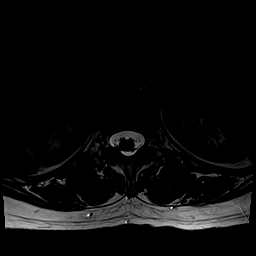
[im 41/44]
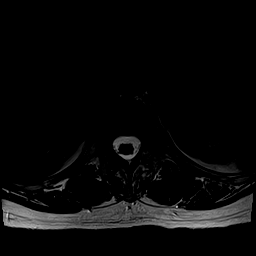

[Series 6: T1 · axial · 4.0mm · 0.35mm/px · z∈[-117,+72]mm · 6 of 44 slices shown (2 of 2)]
[im 3/44]
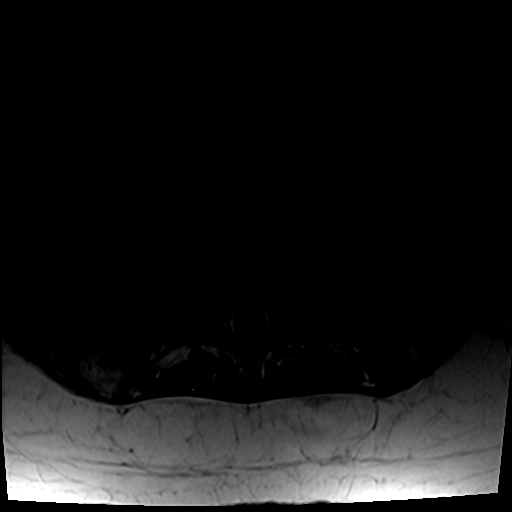
[im 6/44]
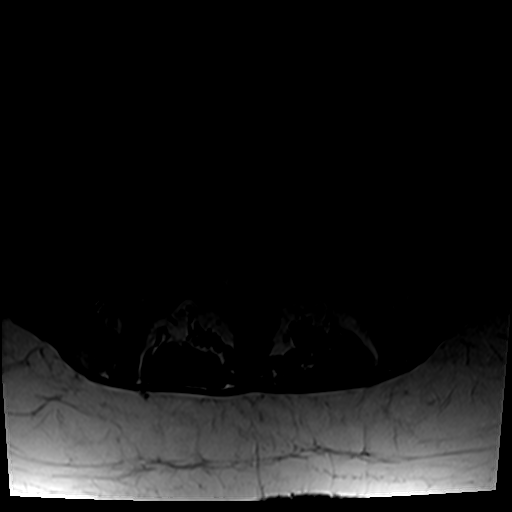
[im 9/44]
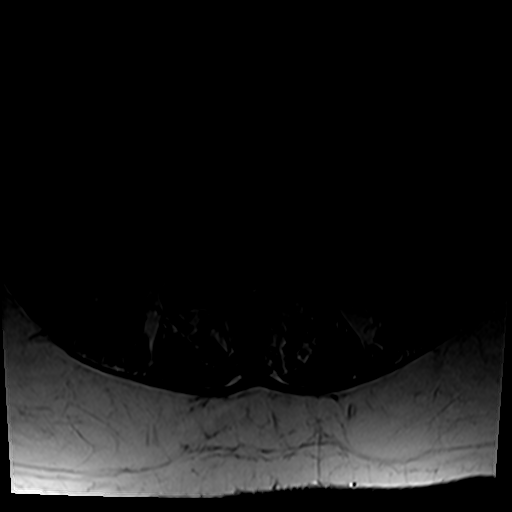
[im 14/44]
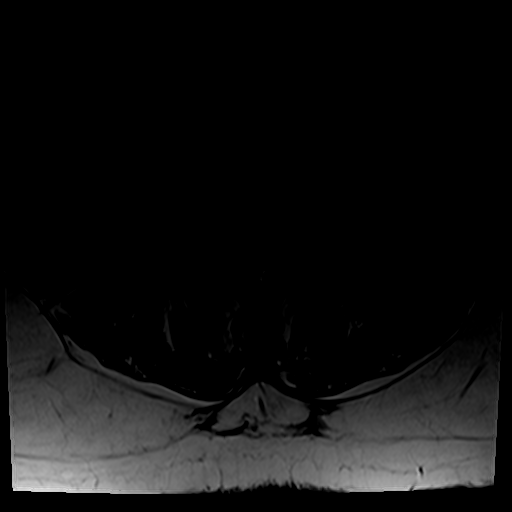
[im 22/44]
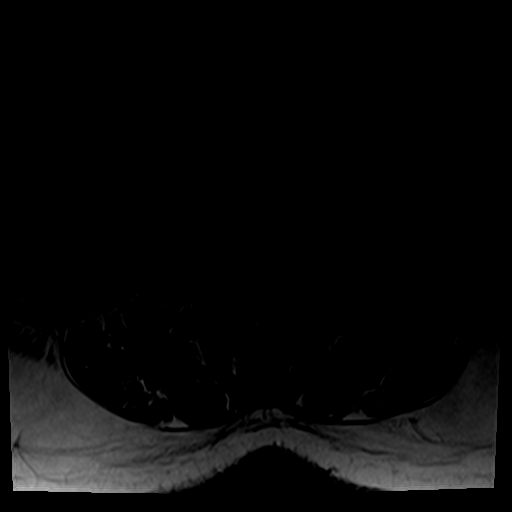
[im 38/44]
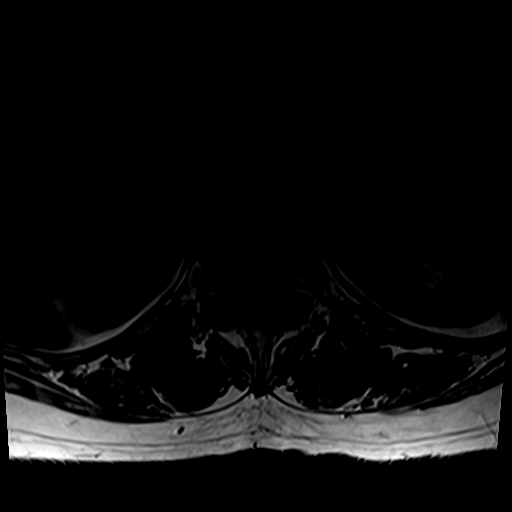

[26 of 48 positions shown; findings below may reference images not displayed]

FINDINGS: Segmentation:  Standard.

Alignment:  Anatomic.

Vertebrae: No worrisome osseous lesion. Minor endplate changes
L5-S1.

Conus medullaris and cauda equina: Conus extends to the L1-L2.
Level. Conus and cauda equina appear normal.

Paraspinal and other soft tissues: Unremarkable.

Disc levels:

L1-L2:  Annular bulge.  No impingement.

L2-L3:  Annular bulge.  No impingement.

L3-L4: Central protrusion. Facet arthropathy. Borderline stenosis.
No definite L4 neural impingement.

L4-L5: Central protrusion, minimally eccentric to the RIGHT.
Posterior element hypertrophy. Mild to moderate stenosis. BILATERAL
RIGHT greater than LEFT L5 neural impingement. BILATERAL foraminal
narrowing could affect either L4 nerve root.

L5-S1: Central and leftward protrusion. Posterior element
hypertrophy. 4 mm synovial cyst on the LEFT. LEFT L5 and S1 nerve
root impingement are likely.
IMPRESSION: Dominant RIGHT-sided abnormality is at L4-5, where central and
rightward protrusion in conjunction with posterior element
hypertrophy results in mild to moderate stenosis with RIGHT greater
than LEFT L5 nerve root impingement. BILATERAL foraminal narrowing
could affect either L4 nerve root.

Central and leftward protrusion at L5-S1 in conjunction with
posterior element hypertrophy, contributes to the LEFT L5 and S1
nerve root impingement.

Central protrusion L3-4.  No definite impingement.

## 2019-05-21 DIAGNOSIS — L814 Other melanin hyperpigmentation: Secondary | ICD-10-CM | POA: Diagnosis not present

## 2019-05-21 DIAGNOSIS — L570 Actinic keratosis: Secondary | ICD-10-CM | POA: Diagnosis not present

## 2019-06-04 DIAGNOSIS — Z20828 Contact with and (suspected) exposure to other viral communicable diseases: Secondary | ICD-10-CM | POA: Diagnosis not present

## 2019-07-15 ENCOUNTER — Ambulatory Visit: Payer: BLUE CROSS/BLUE SHIELD | Admitting: Obstetrics & Gynecology

## 2019-07-22 ENCOUNTER — Encounter: Payer: Self-pay | Admitting: Obstetrics & Gynecology

## 2019-07-22 DIAGNOSIS — Z1231 Encounter for screening mammogram for malignant neoplasm of breast: Secondary | ICD-10-CM | POA: Diagnosis not present

## 2019-07-22 LAB — HM MAMMOGRAPHY

## 2019-07-24 DIAGNOSIS — D225 Melanocytic nevi of trunk: Secondary | ICD-10-CM | POA: Diagnosis not present

## 2019-07-24 DIAGNOSIS — L814 Other melanin hyperpigmentation: Secondary | ICD-10-CM | POA: Diagnosis not present

## 2019-07-24 DIAGNOSIS — L570 Actinic keratosis: Secondary | ICD-10-CM | POA: Diagnosis not present

## 2019-07-24 DIAGNOSIS — D485 Neoplasm of uncertain behavior of skin: Secondary | ICD-10-CM | POA: Diagnosis not present

## 2019-07-24 DIAGNOSIS — L821 Other seborrheic keratosis: Secondary | ICD-10-CM | POA: Diagnosis not present

## 2019-07-24 DIAGNOSIS — D1801 Hemangioma of skin and subcutaneous tissue: Secondary | ICD-10-CM | POA: Diagnosis not present

## 2019-07-25 ENCOUNTER — Encounter: Payer: Self-pay | Admitting: Internal Medicine

## 2019-08-05 ENCOUNTER — Other Ambulatory Visit: Payer: Self-pay

## 2019-08-06 ENCOUNTER — Encounter: Payer: Self-pay | Admitting: Obstetrics & Gynecology

## 2019-08-06 ENCOUNTER — Ambulatory Visit: Payer: BC Managed Care – PPO | Admitting: Obstetrics & Gynecology

## 2019-08-06 VITALS — BP 108/80 | HR 62 | Temp 96.9°F | Ht 62.0 in | Wt 176.0 lb

## 2019-08-06 DIAGNOSIS — Z0000 Encounter for general adult medical examination without abnormal findings: Secondary | ICD-10-CM | POA: Diagnosis not present

## 2019-08-06 DIAGNOSIS — E559 Vitamin D deficiency, unspecified: Secondary | ICD-10-CM | POA: Diagnosis not present

## 2019-08-06 DIAGNOSIS — M4800 Spinal stenosis, site unspecified: Secondary | ICD-10-CM

## 2019-08-06 DIAGNOSIS — Z01419 Encounter for gynecological examination (general) (routine) without abnormal findings: Secondary | ICD-10-CM | POA: Diagnosis not present

## 2019-08-06 NOTE — Progress Notes (Signed)
63 y.o. G0P0000 Single White or Caucasian female here for annual exam.  Having joint issues.  Denies vaginal bleeding.    Mother passed last year and father this year in May.  He ha Patient's last menstrual period was 03/05/2007.          Sexually active: No.  The current method of family planning is post menopausal status.    Exercising: Yes.    walking, biking Smoker:  no  Health Maintenance: Pap:  03/19/18 Neg. HR HPV:neg  12/07/14 Neg  History of abnormal Pap:  no MMG:  07/22/19 BIRADS1:neg Colonoscopy:  01/27/13 normal. F/u 10 years BMD:   04/08/18 Osteopenia TDaP:  2016 Pneumonia vaccine(s):  No Shingrix:   Completed Hep C testing: 02/28/16 Neg  Screening Labs: Here today - fasting   reports that she has never smoked. She has never used smokeless tobacco. She reports current alcohol use. She reports that she does not use drugs.  Past Medical History:  Diagnosis Date  . Anemia   . Cervical polyp 2007  . Dysrhythmia    PMH : PVC's as a child only; benign  . Headache    had only with intolerance to medication  . Hx of breast biopsy 2000  . Hx of malignant melanoma 02/28/2013   2003 followed by derm.    . Left radial fracture 06/2015  . Osteopenia    mild  . Palpitations   . Vitamin D deficiency     Past Surgical History:  Procedure Laterality Date  . BREAST LUMPECTOMY  1984   left breast  . COLONOSCOPY    . OPEN REDUCTION INTERNAL FIXATION (ORIF) WRIST WITH RADIAL BONE GRAFT Left 06/19/2015   Procedure: OPEN REDUCTION INTERNAL FIXATION (ORIF) LEFT WRIST WITH RADIUS AND ULNA WITH ALLOGRAFT BONE GRAFT;  Surgeon: Roseanne Kaufman, MD;  Location: [place];  Service: Orthopedics;  Laterality: Left;  . TONSILLECTOMY  1960  . WISDOM TOOTH EXTRACTION      Current Outpatient Medications  Medication Sig Dispense Refill  . Ascorbic Acid (VITAMIN C) 1000 MG tablet Take 1,000 mg by mouth daily.    . calcium carbonate (TUMS EX) 750 MG chewable tablet Chew 2 tablets by mouth daily.    .  fluorouracil (EFUDEX) 5 % cream     . Multiple Vitamin (MULTIVITAMIN WITH MINERALS) TABS tablet Take 1 tablet by mouth daily.    Marland Kitchen OVER THE COUNTER MEDICATION Place 1 drop into both eyes 2 (two) times daily as needed (dry eyes). Over the counter saline eye drops    . Vitamin D, Ergocalciferol, (DRISDOL) 50000 units CAPS capsule Take 1 capsule (50,000 Units total) by mouth once a week. 12 capsule 1   No current facility-administered medications for this visit.     Family History  Problem Relation Age of Onset  . Hypertension Paternal Uncle   . Lung cancer Other   . Stroke Other   . Other Other        mom viral meningitis  . Osteoarthritis Other   . Alcoholism Other   . Breast cancer Paternal Aunt   . Breast cancer Other        paternal great aunt    Review of Systems  All other systems reviewed and are negative.   Exam:   BP 108/80   Pulse 62   Temp (!) 96.9 F (36.1 C) (Temporal)   Ht 5\' 2"  (1.575 m)   Wt 176 lb (79.8 kg)   LMP 03/05/2007   BMI 32.19  kg/m   Height: 5\' 2"  (157.5 cm)  Ht Readings from Last 3 Encounters:  08/06/19 5\' 2"  (1.575 m)  03/26/18 5' 2.5" (1.588 m)  03/19/18 5' 2.25" (1.581 m)    General appearance: alert, cooperative and appears stated age Head: Normocephalic, without obvious abnormality, atraumatic Neck: no adenopathy, supple, symmetrical, trachea midline and thyroid normal to inspection and palpation Lungs: clear to auscultation bilaterally Breasts: normal appearance, no masses or tenderness Heart: regular rate and rhythm Abdomen: soft, non-tender; bowel sounds normal; no masses,  no organomegaly Extremities: extremities normal, atraumatic, no cyanosis or edema Skin: Skin color, texture, turgor normal. No rashes or lesions Lymph nodes: Cervical, supraclavicular, and axillary nodes normal. No abnormal inguinal nodes palpated Neurologic: Grossly normal   Pelvic: External genitalia:  no lesions              Urethra:  normal appearing  urethra with no masses, tenderness or lesions              Bartholins and Skenes: normal                 Vagina: normal appearing vagina with normal color and discharge, no lesions              Cervix: no lesions              Pap taken: Yes.   Bimanual Exam:  Uterus:  normal size, contour, position, consistency, mobility, non-tender              Adnexa: normal adnexa and no mass, fullness, tenderness               Rectovaginal: Confirms               Anus:  normal sphincter tone, no lesions  Chaperone was present for exam.  A:  Well Woman with normal exam PMP, no HRT H/o melanoma.  Sees derm yearly. H/o Vit D deficiency Osteopenia Spinal stenosis  P:   Mammogram guidelines reviewed.  Doing yearly. pap smear with neg HR HPV 2019.  Not indicated. Colonoscopy is UTD Lab work obtained today:  CBC, CMP, Lipids, Vit D, TSH Has upcoming neurosurgery appt return annually or prn

## 2019-08-07 DIAGNOSIS — M7061 Trochanteric bursitis, right hip: Secondary | ICD-10-CM | POA: Diagnosis not present

## 2019-08-07 DIAGNOSIS — M7062 Trochanteric bursitis, left hip: Secondary | ICD-10-CM | POA: Diagnosis not present

## 2019-08-07 DIAGNOSIS — M545 Low back pain: Secondary | ICD-10-CM | POA: Diagnosis not present

## 2019-08-07 LAB — COMPREHENSIVE METABOLIC PANEL
ALT: 14 IU/L (ref 0–32)
AST: 18 IU/L (ref 0–40)
Albumin/Globulin Ratio: 2 (ref 1.2–2.2)
Albumin: 4.3 g/dL (ref 3.8–4.8)
Alkaline Phosphatase: 69 IU/L (ref 39–117)
BUN/Creatinine Ratio: 20 (ref 12–28)
BUN: 17 mg/dL (ref 8–27)
Bilirubin Total: 0.5 mg/dL (ref 0.0–1.2)
CO2: 24 mmol/L (ref 20–29)
Calcium: 9.6 mg/dL (ref 8.7–10.3)
Chloride: 105 mmol/L (ref 96–106)
Creatinine, Ser: 0.85 mg/dL (ref 0.57–1.00)
GFR calc Af Amer: 84 mL/min/{1.73_m2} (ref >59)
GFR calc non Af Amer: 73 mL/min/{1.73_m2} (ref >59)
Globulin, Total: 2.1 g/dL (ref 1.5–4.5)
Glucose: 87 mg/dL (ref 65–99)
Potassium: 4.5 mmol/L (ref 3.5–5.2)
Sodium: 141 mmol/L (ref 134–144)
Total Protein: 6.4 g/dL (ref 6.0–8.5)

## 2019-08-07 LAB — CBC
Hematocrit: 40.5 % (ref 34.0–46.6)
Hemoglobin: 13.8 g/dL (ref 11.1–15.9)
MCH: 30.1 pg (ref 26.6–33.0)
MCHC: 34.1 g/dL (ref 31.5–35.7)
MCV: 88 fL (ref 79–97)
Platelets: 229 10*3/uL (ref 150–450)
RBC: 4.59 x10E6/uL (ref 3.77–5.28)
RDW: 12.4 % (ref 11.7–15.4)
WBC: 4.5 10*3/uL (ref 3.4–10.8)

## 2019-08-07 LAB — LIPID PANEL
Chol/HDL Ratio: 2.5 ratio (ref 0.0–4.4)
Cholesterol, Total: 191 mg/dL (ref 100–199)
HDL: 75 mg/dL (ref >39)
LDL Chol Calc (NIH): 103 mg/dL — ABNORMAL HIGH (ref 0–99)
Triglycerides: 68 mg/dL (ref 0–149)
VLDL Cholesterol Cal: 13 mg/dL (ref 5–40)

## 2019-08-07 LAB — VITAMIN D 25 HYDROXY (VIT D DEFICIENCY, FRACTURES): Vit D, 25-Hydroxy: 37.6 ng/mL (ref 30.0–100.0)

## 2019-08-07 LAB — TSH: TSH: 2.58 u[IU]/mL (ref 0.450–4.500)

## 2019-09-09 DIAGNOSIS — M79605 Pain in left leg: Secondary | ICD-10-CM | POA: Diagnosis not present

## 2019-09-09 DIAGNOSIS — M79604 Pain in right leg: Secondary | ICD-10-CM | POA: Diagnosis not present

## 2019-09-16 DIAGNOSIS — M545 Low back pain: Secondary | ICD-10-CM | POA: Diagnosis not present

## 2019-09-16 DIAGNOSIS — M7061 Trochanteric bursitis, right hip: Secondary | ICD-10-CM | POA: Diagnosis not present

## 2019-09-16 DIAGNOSIS — M79604 Pain in right leg: Secondary | ICD-10-CM | POA: Diagnosis not present

## 2019-09-19 DIAGNOSIS — Z03818 Encounter for observation for suspected exposure to other biological agents ruled out: Secondary | ICD-10-CM | POA: Diagnosis not present

## 2019-11-26 DIAGNOSIS — M545 Low back pain: Secondary | ICD-10-CM | POA: Diagnosis not present

## 2019-12-01 DIAGNOSIS — H43813 Vitreous degeneration, bilateral: Secondary | ICD-10-CM | POA: Diagnosis not present

## 2019-12-01 DIAGNOSIS — H2513 Age-related nuclear cataract, bilateral: Secondary | ICD-10-CM | POA: Diagnosis not present

## 2019-12-01 DIAGNOSIS — H01021 Squamous blepharitis right upper eyelid: Secondary | ICD-10-CM | POA: Diagnosis not present

## 2019-12-01 DIAGNOSIS — H52223 Regular astigmatism, bilateral: Secondary | ICD-10-CM | POA: Diagnosis not present

## 2019-12-01 DIAGNOSIS — H01024 Squamous blepharitis left upper eyelid: Secondary | ICD-10-CM | POA: Diagnosis not present

## 2019-12-01 DIAGNOSIS — H524 Presbyopia: Secondary | ICD-10-CM | POA: Diagnosis not present

## 2019-12-01 DIAGNOSIS — H5203 Hypermetropia, bilateral: Secondary | ICD-10-CM | POA: Diagnosis not present

## 2019-12-03 DIAGNOSIS — M545 Low back pain: Secondary | ICD-10-CM | POA: Diagnosis not present

## 2019-12-09 DIAGNOSIS — M545 Low back pain: Secondary | ICD-10-CM | POA: Diagnosis not present

## 2019-12-12 DIAGNOSIS — M545 Low back pain: Secondary | ICD-10-CM | POA: Diagnosis not present

## 2019-12-16 DIAGNOSIS — M545 Low back pain: Secondary | ICD-10-CM | POA: Diagnosis not present

## 2019-12-19 DIAGNOSIS — M545 Low back pain: Secondary | ICD-10-CM | POA: Diagnosis not present

## 2019-12-23 DIAGNOSIS — M545 Low back pain: Secondary | ICD-10-CM | POA: Diagnosis not present

## 2019-12-26 DIAGNOSIS — M545 Low back pain: Secondary | ICD-10-CM | POA: Diagnosis not present

## 2019-12-29 DIAGNOSIS — M545 Low back pain: Secondary | ICD-10-CM | POA: Diagnosis not present

## 2020-01-01 DIAGNOSIS — G5601 Carpal tunnel syndrome, right upper limb: Secondary | ICD-10-CM | POA: Diagnosis not present

## 2020-01-01 DIAGNOSIS — M65331 Trigger finger, right middle finger: Secondary | ICD-10-CM | POA: Diagnosis not present

## 2020-01-01 DIAGNOSIS — M79641 Pain in right hand: Secondary | ICD-10-CM | POA: Insufficient documentation

## 2020-01-01 DIAGNOSIS — M65351 Trigger finger, right little finger: Secondary | ICD-10-CM | POA: Diagnosis not present

## 2020-01-02 DIAGNOSIS — M545 Low back pain: Secondary | ICD-10-CM | POA: Diagnosis not present

## 2020-01-06 DIAGNOSIS — M545 Low back pain: Secondary | ICD-10-CM | POA: Diagnosis not present

## 2020-01-13 DIAGNOSIS — M545 Low back pain: Secondary | ICD-10-CM | POA: Diagnosis not present

## 2020-01-20 DIAGNOSIS — M545 Low back pain: Secondary | ICD-10-CM | POA: Diagnosis not present

## 2020-01-29 ENCOUNTER — Ambulatory Visit: Payer: Self-pay | Attending: Family

## 2020-01-29 ENCOUNTER — Other Ambulatory Visit: Payer: Self-pay

## 2020-01-29 DIAGNOSIS — Z23 Encounter for immunization: Secondary | ICD-10-CM | POA: Insufficient documentation

## 2020-01-29 NOTE — Progress Notes (Signed)
   Covid-19 Vaccination Clinic  Name:  Natalie [name]    MRN: [id number] DOB: March 29, 1956  01/29/2020  [name] was observed post Covid-19 immunization for 15 minutes without incidence. She was provided with Vaccine Information Sheet and instruction to access the V-Safe system.   [name] was instructed to call 911 with any severe reactions post vaccine: Marland Kitchen Difficulty breathing  . Swelling of your face and throat  . A fast heartbeat  . A bad rash all over your body  . Dizziness and weakness    Immunizations Administered    Name Date Dose VIS Date Route   Moderna COVID-19 Vaccine 01/29/2020 12:11 PM 0.5 mL 11/04/2019 Intramuscular   Manufacturer: Moderna   Lot: [id number]   [id number]

## 2020-02-09 DIAGNOSIS — H16142 Punctate keratitis, left eye: Secondary | ICD-10-CM | POA: Diagnosis not present

## 2020-02-09 DIAGNOSIS — H16223 Keratoconjunctivitis sicca, not specified as Sjogren's, bilateral: Secondary | ICD-10-CM | POA: Diagnosis not present

## 2020-02-09 DIAGNOSIS — H0102B Squamous blepharitis left eye, upper and lower eyelids: Secondary | ICD-10-CM | POA: Diagnosis not present

## 2020-02-09 DIAGNOSIS — H0102A Squamous blepharitis right eye, upper and lower eyelids: Secondary | ICD-10-CM | POA: Diagnosis not present

## 2020-03-02 ENCOUNTER — Ambulatory Visit: Payer: Self-pay | Attending: Family

## 2020-03-02 DIAGNOSIS — Z23 Encounter for immunization: Secondary | ICD-10-CM

## 2020-03-02 NOTE — Progress Notes (Signed)
   Covid-19 Vaccination Clinic  Name:  Natalie [name]    MRN: [id number] DOB: 09/25/1956  03/02/2020  [name] was observed post Covid-19 immunization for 15 minutes without incident. She was provided with Vaccine Information Sheet and instruction to access the V-Safe system.   [name] was instructed to call 911 with any severe reactions post vaccine: Marland Kitchen Difficulty breathing  . Swelling of face and throat  . A fast heartbeat  . A bad rash all over body  . Dizziness and weakness   Immunizations Administered    Name Date Dose VIS Date Route   Moderna COVID-19 Vaccine 03/02/2020  9:10 AM 0.5 mL 11/04/2019 Intramuscular   Manufacturer: Moderna   LotCA:[id number]   [place]:[id number]

## 2020-04-27 DIAGNOSIS — H16223 Keratoconjunctivitis sicca, not specified as Sjogren's, bilateral: Secondary | ICD-10-CM | POA: Diagnosis not present

## 2020-04-27 DIAGNOSIS — H0102A Squamous blepharitis right eye, upper and lower eyelids: Secondary | ICD-10-CM | POA: Diagnosis not present

## 2020-04-27 DIAGNOSIS — H0288B Meibomian gland dysfunction left eye, upper and lower eyelids: Secondary | ICD-10-CM | POA: Diagnosis not present

## 2020-08-11 DIAGNOSIS — Z1231 Encounter for screening mammogram for malignant neoplasm of breast: Secondary | ICD-10-CM | POA: Diagnosis not present

## 2020-08-11 LAB — HM MAMMOGRAPHY

## 2020-09-03 ENCOUNTER — Telehealth: Payer: Self-pay

## 2020-09-03 NOTE — Telephone Encounter (Signed)
Left message for pt to return call to triage RN. 

## 2020-09-03 NOTE — Telephone Encounter (Signed)
Patient is calling in regards to wanting to know why a bone density order was placed and "what triggered it".

## 2020-09-07 DIAGNOSIS — Z03818 Encounter for observation for suspected exposure to other biological agents ruled out: Secondary | ICD-10-CM | POA: Diagnosis not present

## 2020-09-07 NOTE — Telephone Encounter (Signed)
Spoke with pt. Pt states getting a call from [facility] to have BMD since had orders sent by our office. We received and faxed orders for pt to have BMD on 08/12/20.  Pt advised to call and make appt since over due for BMD. Last BMD 04/2018.  Pt states has scheduled BMD on 10/11 at [facility].  Pt also rescheduled AEX with Dr Sabra Heck due to schedule conflict on 99/77. Pt now scheduled for 11/8 at 4 pm. Pt agreeable and verbalized understanding to date and time of appt.  Routing to Dr Sabra Heck for review.  Encounter closed.

## 2020-09-13 DIAGNOSIS — M8589 Other specified disorders of bone density and structure, multiple sites: Secondary | ICD-10-CM | POA: Diagnosis not present

## 2020-09-20 ENCOUNTER — Telehealth: Payer: Self-pay

## 2020-09-20 NOTE — Telephone Encounter (Signed)
Patient notified of bone density results. See scanned in report

## 2020-10-05 DIAGNOSIS — Z03818 Encounter for observation for suspected exposure to other biological agents ruled out: Secondary | ICD-10-CM | POA: Diagnosis not present

## 2020-10-05 DIAGNOSIS — H40021 Open angle with borderline findings, high risk, right eye: Secondary | ICD-10-CM | POA: Diagnosis not present

## 2020-10-06 DIAGNOSIS — Z23 Encounter for immunization: Secondary | ICD-10-CM | POA: Diagnosis not present

## 2020-10-11 ENCOUNTER — Ambulatory Visit: Payer: Self-pay | Admitting: Obstetrics & Gynecology

## 2020-10-15 ENCOUNTER — Ambulatory Visit: Payer: BC Managed Care – PPO | Admitting: Obstetrics & Gynecology

## 2020-10-18 ENCOUNTER — Encounter: Payer: Self-pay | Admitting: Obstetrics & Gynecology

## 2020-11-04 DIAGNOSIS — D485 Neoplasm of uncertain behavior of skin: Secondary | ICD-10-CM | POA: Diagnosis not present

## 2020-11-04 DIAGNOSIS — L988 Other specified disorders of the skin and subcutaneous tissue: Secondary | ICD-10-CM | POA: Diagnosis not present

## 2020-11-08 ENCOUNTER — Encounter: Payer: Self-pay | Admitting: Obstetrics & Gynecology

## 2020-11-08 ENCOUNTER — Encounter: Payer: Self-pay | Admitting: Radiology

## 2020-11-08 ENCOUNTER — Ambulatory Visit (INDEPENDENT_AMBULATORY_CARE_PROVIDER_SITE_OTHER): Payer: Medicare Other | Admitting: Obstetrics & Gynecology

## 2020-11-08 ENCOUNTER — Other Ambulatory Visit: Payer: Self-pay

## 2020-11-08 VITALS — BP 134/85 | HR 57 | Ht 63.0 in | Wt 170.6 lb

## 2020-11-08 DIAGNOSIS — M25562 Pain in left knee: Secondary | ICD-10-CM

## 2020-11-08 DIAGNOSIS — M8580 Other specified disorders of bone density and structure, unspecified site: Secondary | ICD-10-CM | POA: Diagnosis not present

## 2020-11-08 DIAGNOSIS — Z01419 Encounter for gynecological examination (general) (routine) without abnormal findings: Secondary | ICD-10-CM | POA: Diagnosis not present

## 2020-11-08 DIAGNOSIS — Z0000 Encounter for general adult medical examination without abnormal findings: Secondary | ICD-10-CM | POA: Diagnosis not present

## 2020-11-08 DIAGNOSIS — M4800 Spinal stenosis, site unspecified: Secondary | ICD-10-CM | POA: Diagnosis not present

## 2020-11-08 MED ORDER — VITAMIN D (ERGOCALCIFEROL) 1.25 MG (50000 UNIT) PO CAPS: 50000.0000 [IU] | ORAL_CAPSULE | ORAL | 4 refills | Status: DC

## 2020-11-08 NOTE — Progress Notes (Signed)
64 y.o. [place] Mar[place] White or Caucasian female here for well woman exam.  Pt has not seen PCP this past year.  Wants blood work today.  Denies vaginal bleeding.  Pap guidelines reviewed.    Patient's last menstrual period was 03/05/2007.          Sexually active: No.  The current method of family planning is post menopausal status.    Exercising: Yes.    pickleball, walking Smoker:  no  Health Maintenance: Pap:  03/19/2018 neg.  Neg HR HPV History of abnormal Pap:  no MMG:  08/11/2020 Colonoscopy:  01/27/2013 BMD:   09/13/2020, -2.4 TDaP:  2016 Pneumonia vaccine(s):  D/w pt today.  She will get this at a local pharmacy. Shingrix:   completed Hep C testing: neg 2017 Screening Labs: today   reports that she has never smoked. She has never used smokeless tobacco. She reports current alcohol use. She reports that she does not use drugs.  Past Medical History:  Diagnosis Date   Anemia    Cervical polyp 2007   Dysrhythmia    PMH : PVC's as a child only; benign   Headache    had only with intolerance to medication   Hx of breast biopsy 2000   Hx of malignant melanoma 02/28/2013   2003 followed by derm.     Left radial fracture 06/2015   Osteopenia    mild   Palpitations    Vitamin D deficiency     Past Surgical History:  Procedure Laterality Date   BREAST LUMPECTOMY  1984   left breast   COLONOSCOPY     OPEN REDUCTION INTERNAL FIXATION (ORIF) WRIST WITH RADIAL BONE GRAFT Left 06/19/2015   Procedure: OPEN REDUCTION INTERNAL FIXATION (ORIF) LEFT WRIST WITH RADIUS AND ULNA WITH ALLOGRAFT BONE GRAFT;  Surgeon: Roseanne Kaufman, MD;  Location: [facility];  Service: Orthopedics;  Laterality: Left;   TONSILLECTOMY  1960   WISDOM TOOTH EXTRACTION      Current Outpatient Medications  Medication Sig Dispense Refill   Ascorbic Acid (VITAMIN C) 1000 MG tablet Take 1,000 mg by mouth daily.     calcium carbonate (TUMS EX) 750 MG chewable tablet Chew 2 tablets by mouth daily.      fluorouracil (EFUDEX) 5 % cream      Multiple Vitamin (MULTIVITAMIN WITH MINERALS) TABS tablet Take 1 tablet by mouth daily.     OVER THE COUNTER MEDICATION Place 1 drop into both eyes 2 (two) times daily as needed (dry eyes). Over the counter saline eye drops     Vitamin D, Ergocalciferol, (DRISDOL) 50000 units CAPS capsule Take 1 capsule (50,000 Units total) by mouth once a week. 12 capsule 1   No current facility-administered medications for this visit.    Family History  Problem Relation Age of Onset   Hypertension Paternal Uncle    Lung cancer Other    Stroke Other    Other Other        mom viral meningitis   Osteoarthritis Other    Alcoholism Other    Breast cancer Paternal Aunt    Breast cancer Other        paternal great aunt    Review of Systems  All other systems reviewed and are negative.   Exam:   BP 134/85    Pulse (!) 57    Ht 5\' 3"  (1.6 m)    Wt 170 lb 9.6 oz (77.4 kg)    LMP 03/05/2007    BMI 30.22  kg/m   Height: 5\' 3"  (160 cm)  General appearance: alert, cooperative and appears stated age Head: Normocephalic, without obvious abnormality, atraumatic Neck: no adenopathy, supple, symmetrical, trachea midline and thyroid normal to inspection and palpation Lungs: clear to auscultation bilaterally Breasts: normal appearance, no masses or tenderness Heart: regular rate and rhythm Abdomen: soft, non-tender; bowel sounds normal; no masses,  no organomegaly Extremities: extremities normal, atraumatic, no cyanosis or edema Skin: Skin color, texture, turgor normal. No rashes or lesions Lymph nodes: Cervical, supraclavicular, and axillary nodes normal. No abnormal inguinal nodes palpated Neurologic: Grossly normal   Pelvic: External genitalia:  no lesions              Urethra:  normal appearing urethra with no masses, tenderness or lesions              Bartholins and Skenes: normal                 Vagina: normal appearing vagina with normal color and  discharge, no lesions              Cervix: no lesions              Pap taken: No. Bimanual Exam:  Uterus:  normal size, contour, position, consistency, mobility, non-tender              Adnexa: normal adnexa and no mass, fullness, tenderness               Rectovaginal: Confirms               Anus:  normal sphincter tone, no lesions  Chaperone was present for exam.  Assessment/Plan: 1. Well woman exam with routine gynecological exam -Pap 03/2018 neg with neg HR HPV.  Not indicated today.  Guidelines reviewed and shared decision making about pap smear between 65-75 discussed.  Pt feels comfortable right now stopping.  -MMG 08/11/2020 -Colonoscopy 01/27/2013 -Tdap UTD.  Pt advised to get pneumovax. -PCP options discussed with pt  2. Osteopenia, unspecified location - Repeat BMD in 2023 - VITAMIN D 25 Hydroxy (Vit-D Deficiency, Fractures) - Vitamin D, Ergocalciferol, (DRISDOL) 1.25 MG (50000 UNIT) CAPS capsule; Take 1 capsule (50,000 Units total) by mouth once a week.  Dispense: 12 capsule; Refill: 4  3. Blood tests for routine general physical examination - Lipid panel - HIV Antibody (routine testing w rflx) - Comprehensive metabolic panel  4. Spinal stenosis, unspecified spinal region -Has seen NSG, Dr. Ronnald Ramp  5. Left knee pain, unspecified chronicity -Names for orthopedics given to pt.  She will call if needs referral.

## 2020-11-08 NOTE — Patient Instructions (Signed)
Katy Fitch, MD.  [facility]

## 2020-11-09 LAB — COMPREHENSIVE METABOLIC PANEL
ALT: 16 IU/L (ref 0–32)
AST: 19 IU/L (ref 0–40)
Albumin/Globulin Ratio: 1.8 (ref 1.2–2.2)
Albumin: 4.7 g/dL (ref 3.8–4.8)
Alkaline Phosphatase: 97 IU/L (ref 44–121)
BUN/Creatinine Ratio: 22 (ref 12–28)
BUN: 20 mg/dL (ref 8–27)
Bilirubin Total: 0.7 mg/dL (ref 0.0–1.2)
CO2: 22 mmol/L (ref 20–29)
Calcium: 9.8 mg/dL (ref 8.7–10.3)
Chloride: 103 mmol/L (ref 96–106)
Creatinine, Ser: 0.93 mg/dL (ref 0.57–1.00)
GFR calc Af Amer: 75 mL/min/{1.73_m2} (ref >59)
GFR calc non Af Amer: 65 mL/min/{1.73_m2} (ref >59)
Globulin, Total: 2.6 g/dL (ref 1.5–4.5)
Glucose: 80 mg/dL (ref 65–99)
Potassium: 4 mmol/L (ref 3.5–5.2)
Sodium: 141 mmol/L (ref 134–144)
Total Protein: 7.3 g/dL (ref 6.0–8.5)

## 2020-11-09 LAB — LIPID PANEL
Chol/HDL Ratio: 2.5 ratio (ref 0.0–4.4)
Cholesterol, Total: 241 mg/dL — ABNORMAL HIGH (ref 100–199)
HDL: 97 mg/dL (ref >39)
LDL Chol Calc (NIH): 133 mg/dL — ABNORMAL HIGH (ref 0–99)
Triglycerides: 68 mg/dL (ref 0–149)
VLDL Cholesterol Cal: 11 mg/dL (ref 5–40)

## 2020-11-09 LAB — VITAMIN D 25 HYDROXY (VIT D DEFICIENCY, FRACTURES): Vit D, 25-Hydroxy: 35.2 ng/mL (ref 30.0–100.0)

## 2020-11-09 LAB — HIV ANTIBODY (ROUTINE TESTING W REFLEX): HIV Screen 4th Generation wRfx: NONREACTIVE

## 2020-12-01 ENCOUNTER — Ambulatory Visit: Payer: Medicare Other | Admitting: Orthopaedic Surgery

## 2020-12-03 DIAGNOSIS — H16223 Keratoconjunctivitis sicca, not specified as Sjogren's, bilateral: Secondary | ICD-10-CM | POA: Diagnosis not present

## 2020-12-03 DIAGNOSIS — H0288B Meibomian gland dysfunction left eye, upper and lower eyelids: Secondary | ICD-10-CM | POA: Diagnosis not present

## 2020-12-03 DIAGNOSIS — H52223 Regular astigmatism, bilateral: Secondary | ICD-10-CM | POA: Diagnosis not present

## 2020-12-03 DIAGNOSIS — H0102B Squamous blepharitis left eye, upper and lower eyelids: Secondary | ICD-10-CM | POA: Diagnosis not present

## 2020-12-03 DIAGNOSIS — H524 Presbyopia: Secondary | ICD-10-CM | POA: Diagnosis not present

## 2020-12-15 ENCOUNTER — Encounter: Payer: Self-pay | Admitting: Orthopaedic Surgery

## 2020-12-15 ENCOUNTER — Telehealth: Payer: Self-pay

## 2020-12-15 ENCOUNTER — Ambulatory Visit: Payer: Self-pay

## 2020-12-15 ENCOUNTER — Ambulatory Visit: Payer: Medicare Other | Admitting: Orthopaedic Surgery

## 2020-12-15 VITALS — Ht 63.0 in | Wt 170.6 lb

## 2020-12-15 DIAGNOSIS — M1711 Unilateral primary osteoarthritis, right knee: Secondary | ICD-10-CM | POA: Diagnosis not present

## 2020-12-15 MED ORDER — METHYLPREDNISOLONE ACETATE 40 MG/ML IJ SUSP
40.0000 mg | INTRAMUSCULAR | Status: AC | PRN
  Administered 2020-12-15: 40 mg via INTRA_ARTICULAR

## 2020-12-15 MED ORDER — LIDOCAINE HCL 1 % IJ SOLN
3.0000 mL | INTRAMUSCULAR | Status: AC | PRN
  Administered 2020-12-15: 3 mL

## 2020-12-15 NOTE — Progress Notes (Signed)
Office Visit Note   Patient: Natalie [name]           Date of Birth: Dec 26, 1955           MRN: [id number] Visit Date: 12/15/2020              Requested by: Burnis Medin, MD [place],  [place] [address] PCP: Burnis Medin, MD   Assessment & Plan: Visit Diagnoses:  1. Primary osteoarthritis of right knee     Plan: Dr. Ninfa Linden and myself discussed with the patient at length conservative treatment for her knee arthritis.  This includes quad strengthening exercises as shown, cortisone injections and supplemental injections.  She wishes to proceed with a cortisone injection today.  We will try to gain approval for supplemental injection to have her back once this is available.  Questions were encouraged and answered at length.  Follow-Up Instructions: Return for Supplemental injection.   Orders:  Orders Placed This Encounter  Procedures  . Large Joint Inj  . XR Knee 1-2 Views Right   No orders of the defined types were placed in this encounter.     Procedures: Large Joint Inj: R knee on 12/15/2020 11:35 AM Indications: pain Details: 22 G 1.5 in needle, anterolateral approach  Arthrogram: No  Medications: 3 mL lidocaine 1 %; 40 mg methylPREDNISolone acetate 40 MG/ML Outcome: tolerated well, no immediate complications Procedure, treatment alternatives, risks and benefits explained, specific risks discussed. Consent was given by the patient. Immediately prior to procedure a time out was called to verify the correct patient, procedure, equipment, support staff and site/side marked as required. Patient was prepped and draped in the usual sterile fashion.       Clinical Data: No additional findings.   Subjective: Chief Complaint  Patient presents with  . Right Knee - Pain    HPI Mrs. Brazee comes in today with right knee pain that is worse with activity.  Pain has been ongoing for some time.  She has had no known injury.  She does note occasional  weakness but feels as rare.  Pain is mostly at anterior aspect of the knee.  Uncomfortable sleep with the knee bent.  She has tried Aleve and Voltaren gel which helps some.  Pain in the knee does awaken her at night.  She is nondiabetic.  Patient tolerated cortisone injection well. Review of Systems Denies any fevers chills shortness of breath chest pain.  No vaccines within the last 2 weeks.  Objective: Vital Signs: Ht 5\' 3"  (1.6 m)   Wt 170 lb 9.6 oz (77.4 kg)   LMP 03/05/2007   BMI 30.22 kg/m   Physical Exam Constitutional:      Appearance: She is not ill-appearing or diaphoretic.  Pulmonary:     Effort: Pulmonary effort is normal.  Neurological:     Mental Status: She is alert and oriented to person, place, and time.  Psychiatric:        Mood and Affect: Mood normal.     Ortho Exam Bilateral knees excellent range of motion of both knees.  Slight crepitus with passive range of motion of the right knee.  No instability valgus varus stressing of either knee.  McMurray's is negative bilaterally.  Nontender along medial lateral joint line of both knees.  No abnormal warmth erythema or effusion of either knee. Specialty Comments:  No specialty comments available.  Imaging: XR Knee 1-2 Views Right  Result Date: 12/15/2020 Right knee 2 views:  Shows tricompartmental arthritis with moderate to severe narrowing of the medial compartment and moderate patellofemoral changes.  Osteophytes off the lateral aspect of the lateral joint line.  Knee is well located.  No acute fractures or other bony abnormalities.    PMFS History: Patient Active Problem List   Diagnosis Date Noted  . Osteopenia   . Pain in right hand 01/01/2020  . Carpal tunnel syndrome of right wrist 01/01/2020  . Acquired trigger finger of right little finger 01/01/2020  . Spinal stenosis 08/06/2019  . Hip pain, bilateral 01/25/2018  . Radius fracture 06/19/2015  . Plantar wart of right foot 04/30/2013  . Greater  trochanteric bursitis of right hip 04/30/2013  . Foot pain 02/28/2013  . Hx of malignant melanoma 02/28/2013  . Asymptomatic varicose veins 07/29/2012  . Numbness and tingling in right hand 11/09/2011  . CORNS AND CALLUSES 06/08/2010  . KNEE PAIN, LEFT 08/17/2008  . PAIN IN JOINT OTHER SPECIFIED SITES 07/10/2008  . MYALGIA 07/10/2008  . FATIGUE 07/10/2008   Past Medical History:  Diagnosis Date  . Cervical polyp 2007  . Dysrhythmia    PMH : PVC's as a child only; benign  . Headache    had only with intolerance to medication  . History of anemia   . Hx of breast biopsy 2000  . Hx of malignant melanoma 02/28/2013   2003 followed by derm.    . Left radial fracture 06/2015  . Osteopenia   . Palpitations   . Vitamin D deficiency     Family History  Problem Relation Age of Onset  . Hypertension Paternal Uncle   . Lung cancer Other   . Stroke Other   . Other Other        mom viral meningitis  . Osteoarthritis Other   . Alcoholism Other   . Breast cancer Paternal Aunt   . Breast cancer Other        paternal great aunt    Past Surgical History:  Procedure Laterality Date  . BREAST LUMPECTOMY  1984   left breast  . COLONOSCOPY    . OPEN REDUCTION INTERNAL FIXATION (ORIF) WRIST WITH RADIAL BONE GRAFT Left 06/19/2015   Procedure: OPEN REDUCTION INTERNAL FIXATION (ORIF) LEFT WRIST WITH RADIUS AND ULNA WITH ALLOGRAFT BONE GRAFT;  Surgeon: Roseanne Kaufman, MD;  Location: [facility];  Service: Orthopedics;  Laterality: Left;  . TONSILLECTOMY  1960  . WISDOM TOOTH EXTRACTION     Social History   Occupational History  . Not on file  Tobacco Use  . Smoking status: Never Smoker  . Smokeless tobacco: Never Used  Vaping Use  . Vaping Use: Never used  Substance and Sexual Activity  . Alcohol use: Yes    Alcohol/week: 0.0 - 2.0 standard drinks  . Drug use: No  . Sexual activity: Not Currently    Birth control/protection: Post-menopausal

## 2020-12-15 NOTE — Telephone Encounter (Signed)
Right knee gel injection  

## 2020-12-16 NOTE — Telephone Encounter (Signed)
Noted  

## 2020-12-28 ENCOUNTER — Telehealth: Payer: Self-pay

## 2020-12-28 NOTE — Telephone Encounter (Signed)
Submitted VOB for Durolane, right knee. 

## 2020-12-29 ENCOUNTER — Telehealth: Payer: Self-pay

## 2020-12-29 NOTE — Telephone Encounter (Signed)
Approved for Durolane, right knee. [place] Patient will be responsible for 20% OOP. $30.00 Co-pay No PA required  Appt. 01/10/2021 with Dr. Ninfa Linden

## 2021-01-10 ENCOUNTER — Ambulatory Visit: Payer: Medicare Other | Admitting: Orthopaedic Surgery

## 2021-01-11 DIAGNOSIS — H53452 Other localized visual field defect, left eye: Secondary | ICD-10-CM | POA: Diagnosis not present

## 2021-01-11 DIAGNOSIS — H40021 Open angle with borderline findings, high risk, right eye: Secondary | ICD-10-CM | POA: Diagnosis not present

## 2021-01-13 ENCOUNTER — Ambulatory Visit: Payer: Medicare Other | Admitting: Orthopaedic Surgery

## 2021-01-13 ENCOUNTER — Encounter: Payer: Self-pay | Admitting: Orthopaedic Surgery

## 2021-01-13 DIAGNOSIS — M1711 Unilateral primary osteoarthritis, right knee: Secondary | ICD-10-CM | POA: Diagnosis not present

## 2021-01-13 MED ORDER — SODIUM HYALURONATE 60 MG/3ML IX PRSY
60.0000 mg | PREFILLED_SYRINGE | INTRA_ARTICULAR | Status: AC | PRN
  Administered 2021-01-13: 60 mg via INTRA_ARTICULAR

## 2021-01-13 NOTE — Progress Notes (Signed)
   Procedure Note  Patient: Natalie [name]             Date of Birth: 30-Nov-1956           MRN: [id number]             Visit Date: 01/13/2021  Procedures: Visit Diagnoses:  1. Primary osteoarthritis of right knee     Large Joint Inj: R knee on 01/13/2021 3:08 PM Indications: diagnostic evaluation and pain Details: 22 G 1.5 in needle, superolateral approach  Arthrogram: No  Medications: 60 mg Sodium Hyaluronate 60 MG/3ML Outcome: tolerated well, no immediate complications Procedure, treatment alternatives, risks and benefits explained, specific risks discussed. Consent was given by the patient. Immediately prior to procedure a time out was called to verify the correct patient, procedure, equipment, support staff and site/side marked as required. Patient was prepped and draped in the usual sterile fashion.    The patient comes in today for scheduled hyaluronic acid injection in her right knee to treat the pain from osteoarthritis.  She has tried and failed other conservative treatment measures and has had a steroid.  She is a very active individual is 65 years old.  We talked in detail about the exercises she should and should not do.  I did place Durolane in the right knee today without difficulty.  All questions and concerns were answered and addressed.  Follow-up can be as needed.

## 2021-01-20 ENCOUNTER — Encounter: Payer: Self-pay | Admitting: Orthopaedic Surgery

## 2021-01-20 ENCOUNTER — Encounter (HOSPITAL_BASED_OUTPATIENT_CLINIC_OR_DEPARTMENT_OTHER): Payer: Self-pay

## 2021-03-15 DIAGNOSIS — Z20822 Contact with and (suspected) exposure to covid-19: Secondary | ICD-10-CM | POA: Diagnosis not present

## 2021-03-22 DIAGNOSIS — Z20822 Contact with and (suspected) exposure to covid-19: Secondary | ICD-10-CM | POA: Diagnosis not present

## 2021-03-22 DIAGNOSIS — Z03818 Encounter for observation for suspected exposure to other biological agents ruled out: Secondary | ICD-10-CM | POA: Diagnosis not present

## 2021-05-01 DIAGNOSIS — S39012A Strain of muscle, fascia and tendon of lower back, initial encounter: Secondary | ICD-10-CM | POA: Diagnosis not present

## 2021-05-01 DIAGNOSIS — R079 Chest pain, unspecified: Secondary | ICD-10-CM | POA: Diagnosis not present

## 2021-05-01 DIAGNOSIS — R109 Unspecified abdominal pain: Secondary | ICD-10-CM | POA: Diagnosis not present

## 2021-07-25 DIAGNOSIS — Z23 Encounter for immunization: Secondary | ICD-10-CM | POA: Diagnosis not present

## 2021-07-26 DIAGNOSIS — L718 Other rosacea: Secondary | ICD-10-CM | POA: Diagnosis not present

## 2021-07-26 DIAGNOSIS — Z85820 Personal history of malignant melanoma of skin: Secondary | ICD-10-CM | POA: Diagnosis not present

## 2021-07-26 DIAGNOSIS — B078 Other viral warts: Secondary | ICD-10-CM | POA: Diagnosis not present

## 2021-07-26 DIAGNOSIS — L814 Other melanin hyperpigmentation: Secondary | ICD-10-CM | POA: Diagnosis not present

## 2021-07-26 DIAGNOSIS — L918 Other hypertrophic disorders of the skin: Secondary | ICD-10-CM | POA: Diagnosis not present

## 2021-07-26 DIAGNOSIS — D485 Neoplasm of uncertain behavior of skin: Secondary | ICD-10-CM | POA: Diagnosis not present

## 2021-07-26 DIAGNOSIS — L821 Other seborrheic keratosis: Secondary | ICD-10-CM | POA: Diagnosis not present

## 2021-07-26 DIAGNOSIS — D3617 Benign neoplasm of peripheral nerves and autonomic nervous system of trunk, unspecified: Secondary | ICD-10-CM | POA: Diagnosis not present

## 2021-07-26 DIAGNOSIS — L853 Xerosis cutis: Secondary | ICD-10-CM | POA: Diagnosis not present

## 2021-07-26 DIAGNOSIS — D1801 Hemangioma of skin and subcutaneous tissue: Secondary | ICD-10-CM | POA: Diagnosis not present

## 2021-07-26 DIAGNOSIS — D2272 Melanocytic nevi of left lower limb, including hip: Secondary | ICD-10-CM | POA: Diagnosis not present

## 2021-07-26 DIAGNOSIS — L570 Actinic keratosis: Secondary | ICD-10-CM | POA: Diagnosis not present

## 2021-08-12 DIAGNOSIS — Z1231 Encounter for screening mammogram for malignant neoplasm of breast: Secondary | ICD-10-CM | POA: Diagnosis not present

## 2021-08-12 LAB — HM MAMMOGRAPHY

## 2021-08-15 ENCOUNTER — Encounter: Payer: Self-pay | Admitting: Internal Medicine

## 2021-08-17 ENCOUNTER — Encounter (HOSPITAL_BASED_OUTPATIENT_CLINIC_OR_DEPARTMENT_OTHER): Payer: Self-pay | Admitting: *Deleted

## 2021-09-14 ENCOUNTER — Ambulatory Visit
Admission: AD | Admit: 2021-09-14 | Discharge: 2021-09-14 | Disposition: A | Discharge status: Home / Self Care | Payer: Medicare (Managed Care) | Source: Ambulatory Visit | Attending: Emergency Medicine | Admitting: Emergency Medicine

## 2021-09-14 ENCOUNTER — Other Ambulatory Visit: Payer: Self-pay

## 2021-09-14 DIAGNOSIS — S30861A Insect bite (nonvenomous) of abdominal wall, initial encounter: Secondary | ICD-10-CM | POA: Diagnosis not present

## 2021-09-14 DIAGNOSIS — W57XXXA Bitten or stung by nonvenomous insect and other nonvenomous arthropods, initial encounter: Secondary | ICD-10-CM | POA: Diagnosis not present

## 2021-09-14 MED ORDER — DOXYCYCLINE HYCLATE 100 MG PO CAPS *I*
200.0000 mg | ORAL_CAPSULE | Freq: Once | ORAL | 0 refills | Status: AC
Start: 2021-09-14 — End: 2021-09-14

## 2021-09-14 NOTE — UC Provider Note (Signed)
History     Chief Complaint   Patient presents with    Insect Bite     65 year old female here for concerns of tick bite.  Patient reports that 2 days ago she was out walking, later that day noticed a tick attached to the abdominal area which she promptly removed.  Patient reports that initially the redness is worse, that has improved although now she is concerned with bull's-eye.  She denies other symptoms.      History provided by:  Patient  Language interpreter used: No        Medical/Surgical/Family History     History reviewed. No pertinent past medical history.     There is no problem list on file for this patient.           History reviewed. No pertinent surgical history.  History reviewed. No pertinent family history.       Social History     Tobacco Use    Smoking status: Not on file    Smokeless tobacco: Not on file   Substance Use Topics    Alcohol use: Not on file    Drug use: Not on file     Living Situation     Questions Responses    Patient lives with     Homeless     Caregiver for other family member     External Services     Employment     Domestic Violence Risk                 Review of Systems   Review of Systems   Skin: Positive for color change.   All other systems reviewed and are negative.      Physical Exam   Vital Signs  Vitals:    09/14/21 1151   BP: 143/84   Pulse: 63   Resp: 18   Temp: 36.4 C (97.5 F)   SpO2: 99%     First Pain Reported  0-10 Scale: 0, (09/14/21 1151)       Physical Exam  Vitals and nursing note reviewed.   Constitutional:       Appearance: She is well-developed.   HENT:      Head: Normocephalic and atraumatic.   Eyes:      Conjunctiva/sclera: Conjunctivae normal.   Cardiovascular:      Rate and Rhythm: Normal rate.   Pulmonary:      Effort: Pulmonary effort is normal.   Musculoskeletal:      Cervical back: Normal range of motion.   Skin:     General: Skin is warm.          Neurological:      Mental Status: She is alert and oriented to person, place, and time.    Psychiatric:         Behavior: Behavior normal.         Thought Content: Thought content normal.         Judgment: Judgment normal.          I,Savhanna Sliva Margy Clarks, PA,certify that the patient has authorized the use of photography for the purpose of the provision of health care at the [facility] of [facility] and affiliates and they understand that it will be included in the legal medical record.      Medical Decision Making        Medical Decision Making  Assessment:    65 year old female who is visiting from [place] here with concerns  of bull's-eye rash following tick bite.  Patient went for a walk 2 days ago, noticed a tick attached to the abdominal wall, she promptly removed, tick was not attached for more than 12 hours, it is not engorged she generally feels well.  Provided reassurance to the patient that the likelihood of transmission of Lyme is low as tick was not attached for a prolonged period of time.  We will give her the 1 dose prophylactic doxycycline.    Differential diagnosis:    Tick bite  Cellulitis  Lyme disease    Plan and Results:              Orders Placed This Encounter    doxycycline hyclate (VIBRAMYCIN) 100 mg capsule       No results found for this or any previous visit (from the past 24 hour(s)).      Final Diagnosis  Final diagnoses:   [F64.332R, [id number]] Tick bite of abdomen, initial encounter (Primary)       Rest, hydration, and good hand hygiene recommended.  Symptom relief, warning signs and infection control reviewed.  Use of relevant over the counter and already prescribed medications discussed.    Please start the new medications as below:  Current Discharge Medication List      New Medications    Details Last Dose Given Next Dose Due Script Given?   doxycycline hyclate (VIBRAMYCIN) 200 mg Dose: 200 mg  Take 200 mg by mouth once   Quantity 2 capsule, Refill 0  Start date: 09/14/2021, End date: 09/14/2021       Comments: Emergency Encounter                   Please  follow up with your physician as below:   Follow-up Information     Berniece Andreas, MD In 1 week.    Specialty: Internal Medicine  Why: As needed  Contact information:  [address] [place]  [place] [address]  [phone]                         Discharge Instructions       Keep area clean, wash gently with mild soap twice a day    Redness should get better in approximately 48 hours    Watch for any signs of Lyme Disease such as   -Fever  -Chills  -Body Aches  -Malaise  -Fatigue  -Bulls Eye looking Rash        We discussed since the tick was not embedded for more than 24 hours, the risk of transmission of Lyme Disease is very low. We still recommend monitoring for signs listed above.     Approach to prophylaxis -- We agree with the Infectious Diseases Society of America (IDSA) guidelines that recommend antibiotic prophylaxis only in patients who meet all of the following criteria:    ?Attached tick identified as an adult or nymphal I. scapularis tick (deer tick).    ?Tick is estimated to have been attached for ?36 hours (by degree of engorgement or time of exposure).    ?Prophylaxis is begun within 72 hours of tick removal.    ?Local rate of infection of ticks with B. burgdorferi is ?20 percent (these rates of infection have been shown to occur in parts of [place], parts of the [address], and parts of [place] and [place]).            Final Diagnosis  Final diagnoses:   [  H60.677C, H40.XXXA] Tick bite of abdomen, initial encounter (Primary)         Taylore Hinde Margy Clarks, PA              [facility], [place]  09/14/21 1209

## 2021-09-14 NOTE — Discharge Instructions (Signed)
Keep area clean, wash gently with mild soap twice a day    Redness should get better in approximately 48 hours    Watch for any signs of Lyme Disease such as   -Fever  -Chills  -Body Aches  -Malaise  -Fatigue  -Bulls Eye looking Rash            We discussed since the tick was not embedded for more than 24 hours, the risk of transmission of Lyme Disease is very low. We still recommend monitoring for signs listed above.     Approach to prophylaxis -- We agree with the Infectious Diseases Society of America (IDSA) guidelines that recommend antibiotic prophylaxis only in patients who meet all of the following criteria:    ?Attached tick identified as an adult or nymphal I. scapularis tick (deer tick).    ?Tick is estimated to have been attached for ?36 hours (by degree of engorgement or time of exposure).    ?Prophylaxis is begun within 72 hours of tick removal.    ?Local rate of infection of ticks with B. burgdorferi is ?20 percent (these rates of infection have been shown to occur in parts of [place], parts of the [place], and parts of [place] and [place]).

## 2021-09-14 NOTE — ED Triage Notes (Signed)
Pt reports noticing a  Tick around her navel 4 days ago. She removed the tick on that day. She now states there is a red circle around where the bite was. Denies fever/ myalgias.

## 2021-09-16 ENCOUNTER — Other Ambulatory Visit: Payer: Self-pay | Admitting: Emergency Medicine

## 2021-09-16 MED ORDER — DOXYCYCLINE HYCLATE 100 MG PO CAPS *I*
100.0000 mg | ORAL_CAPSULE | Freq: Two times a day (BID) | ORAL | 0 refills | Status: DC
Start: 2021-09-16 — End: 2021-09-18

## 2021-09-16 NOTE — ED Provider Progress Notes (Signed)
Called patient to check on tick bite, reports that the area has become more red, it is nontender, she generally feels well.  Recommended that she monitor the area, will send doxycycline prescription into the pharmacy.  All questions were addressed.     [facility], [place]  09/16/21 [phone]

## 2021-09-18 ENCOUNTER — Other Ambulatory Visit: Payer: Self-pay | Admitting: Emergency Medicine

## 2021-09-18 MED ORDER — DOXYCYCLINE HYCLATE 100 MG PO CAPS *I*
100.0000 mg | ORAL_CAPSULE | Freq: Two times a day (BID) | ORAL | 0 refills | Status: AC
Start: 2021-09-18 — End: 2021-09-28

## 2021-09-18 NOTE — ED Provider Progress Notes (Signed)
Pt reached out asking for doxycycline to be sent to a pharmacy in her home town of [place].  Continues to have red area at site of tick bite, otherwise feels fine.     [facility], [place]  09/18/21 [phone]

## 2021-09-21 DIAGNOSIS — Z23 Encounter for immunization: Secondary | ICD-10-CM | POA: Diagnosis not present

## 2021-09-26 ENCOUNTER — Telehealth: Payer: Self-pay

## 2021-09-26 ENCOUNTER — Encounter (HOSPITAL_BASED_OUTPATIENT_CLINIC_OR_DEPARTMENT_OTHER): Payer: Self-pay

## 2021-09-26 NOTE — Telephone Encounter (Signed)
I spoke with the pt and she stated she first noticed tick bite on 10/10 and was seen in the ED by a PA on 09/14/2021 in [place] state. Pt was prescribed 2 dose Doxycycline 100 mg. Pt completed antibiotic and was contacted for follow up by PA that prescribed rx in the ED and she was prescribed 10 day dose of Doxycycline due to ongoing redness and irritation from tick bite. Pt reported on 09/24/2021 she started to have side effects from medication including tingling in both hand around her thumb, index finger and the back of both hands. Pt stated that her hands are sensitive to touch and they have a "dry" sensation as well. Pt stated that sunlight also causes tingling and hot sensation to emerge. F/u visit scheduled for 09/27/2021 with Dorothyann Peng, NP Please advise.

## 2021-09-26 NOTE — Telephone Encounter (Signed)
Access nurse called stating patient is taking antibiotics for a tick bite and has developed tingling in arm and triage nurse is concerned of side effects and feels she should be seen in 24 hrs

## 2021-09-26 NOTE — Telephone Encounter (Signed)
Stop the medicine in case  Why is taking it and how long and who prescribed .  Fu visit if needed

## 2021-09-27 ENCOUNTER — Telehealth: Payer: Medicare Other | Admitting: Adult Health

## 2021-09-27 NOTE — Telephone Encounter (Signed)
I spoke with the pt and she stated she has appt with another MD and will follow up with them in regards to her medication. Per pt request appt has been cancelled for 09/27/21 with [facility].

## 2021-09-27 NOTE — Telephone Encounter (Signed)
Access Nurse Triage Line:  "caller states she started antibiotic on the 17th for tick bite. she removed the tick and it was sore. then the next day it got a deeper red ring so she went to UC on the 12th and got a dose of antibiotic that day. then 2 days ago the top of her hand started feeling tingling/burning."  09/26/2021 2:08:19 PM SEE PCP WITHIN 3 DAYS Yes Humfleet, RN, Amanda  Pt was scheduled with OV with Dorothyann Peng, NP for 09/27/21 (appt scheduled on 09/26/21 at 5:20).

## 2021-09-29 ENCOUNTER — Encounter (HOSPITAL_BASED_OUTPATIENT_CLINIC_OR_DEPARTMENT_OTHER): Payer: Self-pay | Admitting: Obstetrics & Gynecology

## 2021-10-06 ENCOUNTER — Other Ambulatory Visit: Payer: Self-pay | Admitting: Internal Medicine

## 2021-10-19 DIAGNOSIS — Z23 Encounter for immunization: Secondary | ICD-10-CM | POA: Diagnosis not present

## 2021-11-08 DIAGNOSIS — H0102A Squamous blepharitis right eye, upper and lower eyelids: Secondary | ICD-10-CM | POA: Diagnosis not present

## 2021-11-08 DIAGNOSIS — H5203 Hypermetropia, bilateral: Secondary | ICD-10-CM | POA: Diagnosis not present

## 2021-11-08 DIAGNOSIS — H35363 Drusen (degenerative) of macula, bilateral: Secondary | ICD-10-CM | POA: Diagnosis not present

## 2021-11-08 DIAGNOSIS — H524 Presbyopia: Secondary | ICD-10-CM | POA: Diagnosis not present

## 2021-11-08 DIAGNOSIS — H52223 Regular astigmatism, bilateral: Secondary | ICD-10-CM | POA: Diagnosis not present

## 2021-11-08 DIAGNOSIS — H40021 Open angle with borderline findings, high risk, right eye: Secondary | ICD-10-CM | POA: Diagnosis not present

## 2021-11-08 DIAGNOSIS — H2513 Age-related nuclear cataract, bilateral: Secondary | ICD-10-CM | POA: Diagnosis not present

## 2022-01-23 DIAGNOSIS — M1711 Unilateral primary osteoarthritis, right knee: Secondary | ICD-10-CM | POA: Diagnosis not present

## 2022-03-15 DIAGNOSIS — M25561 Pain in right knee: Secondary | ICD-10-CM | POA: Diagnosis not present

## 2022-03-20 DIAGNOSIS — M25561 Pain in right knee: Secondary | ICD-10-CM | POA: Diagnosis not present

## 2022-03-22 DIAGNOSIS — M25561 Pain in right knee: Secondary | ICD-10-CM | POA: Diagnosis not present

## 2022-03-27 DIAGNOSIS — M25561 Pain in right knee: Secondary | ICD-10-CM | POA: Diagnosis not present

## 2022-03-29 DIAGNOSIS — M25561 Pain in right knee: Secondary | ICD-10-CM | POA: Diagnosis not present

## 2022-04-03 DIAGNOSIS — M65331 Trigger finger, right middle finger: Secondary | ICD-10-CM | POA: Diagnosis not present

## 2022-04-03 DIAGNOSIS — M65351 Trigger finger, right little finger: Secondary | ICD-10-CM | POA: Diagnosis not present

## 2022-04-06 DIAGNOSIS — M65331 Trigger finger, right middle finger: Secondary | ICD-10-CM | POA: Diagnosis not present

## 2022-04-06 DIAGNOSIS — M65351 Trigger finger, right little finger: Secondary | ICD-10-CM | POA: Diagnosis not present

## 2022-04-10 DIAGNOSIS — M25561 Pain in right knee: Secondary | ICD-10-CM | POA: Diagnosis not present

## 2022-04-13 DIAGNOSIS — M25561 Pain in right knee: Secondary | ICD-10-CM | POA: Diagnosis not present

## 2022-04-14 DIAGNOSIS — M65351 Trigger finger, right little finger: Secondary | ICD-10-CM | POA: Diagnosis not present

## 2022-04-14 DIAGNOSIS — M65331 Trigger finger, right middle finger: Secondary | ICD-10-CM | POA: Diagnosis not present

## 2022-04-17 DIAGNOSIS — M25561 Pain in right knee: Secondary | ICD-10-CM | POA: Diagnosis not present

## 2022-04-19 DIAGNOSIS — M65351 Trigger finger, right little finger: Secondary | ICD-10-CM | POA: Diagnosis not present

## 2022-04-19 DIAGNOSIS — M65331 Trigger finger, right middle finger: Secondary | ICD-10-CM | POA: Diagnosis not present

## 2022-04-19 DIAGNOSIS — G5601 Carpal tunnel syndrome, right upper limb: Secondary | ICD-10-CM | POA: Diagnosis not present

## 2022-04-19 DIAGNOSIS — M79641 Pain in right hand: Secondary | ICD-10-CM | POA: Diagnosis not present

## 2022-04-20 DIAGNOSIS — M25561 Pain in right knee: Secondary | ICD-10-CM | POA: Diagnosis not present

## 2022-04-21 DIAGNOSIS — M65331 Trigger finger, right middle finger: Secondary | ICD-10-CM | POA: Diagnosis not present

## 2022-04-21 DIAGNOSIS — M65351 Trigger finger, right little finger: Secondary | ICD-10-CM | POA: Diagnosis not present

## 2022-04-24 DIAGNOSIS — M25561 Pain in right knee: Secondary | ICD-10-CM | POA: Diagnosis not present

## 2022-04-26 DIAGNOSIS — M25561 Pain in right knee: Secondary | ICD-10-CM | POA: Diagnosis not present

## 2022-04-27 DIAGNOSIS — M65351 Trigger finger, right little finger: Secondary | ICD-10-CM | POA: Diagnosis not present

## 2022-04-27 DIAGNOSIS — M65331 Trigger finger, right middle finger: Secondary | ICD-10-CM | POA: Diagnosis not present

## 2022-05-03 DIAGNOSIS — M25561 Pain in right knee: Secondary | ICD-10-CM | POA: Diagnosis not present

## 2022-05-04 DIAGNOSIS — M25561 Pain in right knee: Secondary | ICD-10-CM | POA: Diagnosis not present

## 2022-05-05 DIAGNOSIS — M65331 Trigger finger, right middle finger: Secondary | ICD-10-CM | POA: Diagnosis not present

## 2022-05-05 DIAGNOSIS — M65351 Trigger finger, right little finger: Secondary | ICD-10-CM | POA: Diagnosis not present

## 2022-05-08 DIAGNOSIS — M25561 Pain in right knee: Secondary | ICD-10-CM | POA: Diagnosis not present

## 2022-06-07 DIAGNOSIS — M25561 Pain in right knee: Secondary | ICD-10-CM | POA: Diagnosis not present

## 2022-06-08 DIAGNOSIS — M65351 Trigger finger, right little finger: Secondary | ICD-10-CM | POA: Diagnosis not present

## 2022-06-08 DIAGNOSIS — M65331 Trigger finger, right middle finger: Secondary | ICD-10-CM | POA: Diagnosis not present

## 2022-06-21 DIAGNOSIS — M25561 Pain in right knee: Secondary | ICD-10-CM | POA: Diagnosis not present

## 2022-06-29 DIAGNOSIS — R2231 Localized swelling, mass and lump, right upper limb: Secondary | ICD-10-CM | POA: Diagnosis not present

## 2022-06-29 DIAGNOSIS — M65331 Trigger finger, right middle finger: Secondary | ICD-10-CM | POA: Diagnosis not present

## 2022-06-29 DIAGNOSIS — M79641 Pain in right hand: Secondary | ICD-10-CM | POA: Diagnosis not present

## 2022-06-29 DIAGNOSIS — M65351 Trigger finger, right little finger: Secondary | ICD-10-CM | POA: Diagnosis not present

## 2022-07-27 DIAGNOSIS — I872 Venous insufficiency (chronic) (peripheral): Secondary | ICD-10-CM | POA: Diagnosis not present

## 2022-07-27 DIAGNOSIS — L308 Other specified dermatitis: Secondary | ICD-10-CM | POA: Diagnosis not present

## 2022-07-27 DIAGNOSIS — L570 Actinic keratosis: Secondary | ICD-10-CM | POA: Diagnosis not present

## 2022-07-27 DIAGNOSIS — L821 Other seborrheic keratosis: Secondary | ICD-10-CM | POA: Diagnosis not present

## 2022-07-27 DIAGNOSIS — D692 Other nonthrombocytopenic purpura: Secondary | ICD-10-CM | POA: Diagnosis not present

## 2022-07-27 DIAGNOSIS — L918 Other hypertrophic disorders of the skin: Secondary | ICD-10-CM | POA: Diagnosis not present

## 2022-07-27 DIAGNOSIS — Z85820 Personal history of malignant melanoma of skin: Secondary | ICD-10-CM | POA: Diagnosis not present

## 2022-07-27 DIAGNOSIS — L814 Other melanin hyperpigmentation: Secondary | ICD-10-CM | POA: Diagnosis not present

## 2022-07-27 DIAGNOSIS — D1801 Hemangioma of skin and subcutaneous tissue: Secondary | ICD-10-CM | POA: Diagnosis not present

## 2022-08-21 DIAGNOSIS — Z1231 Encounter for screening mammogram for malignant neoplasm of breast: Secondary | ICD-10-CM | POA: Diagnosis not present

## 2022-08-24 ENCOUNTER — Encounter (HOSPITAL_BASED_OUTPATIENT_CLINIC_OR_DEPARTMENT_OTHER): Payer: Self-pay | Admitting: *Deleted

## 2022-09-13 DIAGNOSIS — Z23 Encounter for immunization: Secondary | ICD-10-CM | POA: Diagnosis not present

## 2022-10-05 DIAGNOSIS — Z23 Encounter for immunization: Secondary | ICD-10-CM | POA: Diagnosis not present

## 2022-12-01 DIAGNOSIS — H0102B Squamous blepharitis left eye, upper and lower eyelids: Secondary | ICD-10-CM | POA: Diagnosis not present

## 2022-12-01 DIAGNOSIS — H40021 Open angle with borderline findings, high risk, right eye: Secondary | ICD-10-CM | POA: Diagnosis not present

## 2022-12-01 DIAGNOSIS — H2513 Age-related nuclear cataract, bilateral: Secondary | ICD-10-CM | POA: Diagnosis not present

## 2022-12-01 DIAGNOSIS — H0102A Squamous blepharitis right eye, upper and lower eyelids: Secondary | ICD-10-CM | POA: Diagnosis not present

## 2023-02-15 ENCOUNTER — Ambulatory Visit (INDEPENDENT_AMBULATORY_CARE_PROVIDER_SITE_OTHER): Payer: Medicare Other | Admitting: Obstetrics & Gynecology

## 2023-02-15 ENCOUNTER — Encounter (HOSPITAL_BASED_OUTPATIENT_CLINIC_OR_DEPARTMENT_OTHER): Payer: Self-pay | Admitting: Obstetrics & Gynecology

## 2023-02-15 ENCOUNTER — Other Ambulatory Visit (HOSPITAL_BASED_OUTPATIENT_CLINIC_OR_DEPARTMENT_OTHER): Payer: Self-pay

## 2023-02-15 ENCOUNTER — Other Ambulatory Visit (HOSPITAL_COMMUNITY)
Admission: RE | Admit: 2023-02-15 | Discharge: 2023-02-15 | Disposition: A | Discharge status: Home / Self Care | Payer: Medicare Other | Source: Ambulatory Visit | Attending: Obstetrics & Gynecology | Admitting: Obstetrics & Gynecology

## 2023-02-15 VITALS — BP 118/82 | HR 58 | Ht 60.35 in | Wt 172.4 lb

## 2023-02-15 DIAGNOSIS — Z124 Encounter for screening for malignant neoplasm of cervix: Secondary | ICD-10-CM | POA: Insufficient documentation

## 2023-02-15 DIAGNOSIS — Z01419 Encounter for gynecological examination (general) (routine) without abnormal findings: Secondary | ICD-10-CM

## 2023-02-15 DIAGNOSIS — E559 Vitamin D deficiency, unspecified: Secondary | ICD-10-CM | POA: Diagnosis not present

## 2023-02-15 DIAGNOSIS — M8588 Other specified disorders of bone density and structure, other site: Secondary | ICD-10-CM

## 2023-02-15 DIAGNOSIS — E7800 Pure hypercholesterolemia, unspecified: Secondary | ICD-10-CM | POA: Diagnosis not present

## 2023-02-15 DIAGNOSIS — M85852 Other specified disorders of bone density and structure, left thigh: Secondary | ICD-10-CM

## 2023-02-15 DIAGNOSIS — M85851 Other specified disorders of bone density and structure, right thigh: Secondary | ICD-10-CM

## 2023-02-15 MED ORDER — PREVNAR 20 0.5 ML IM SUSY
PREFILLED_SYRINGE | INTRAMUSCULAR | 0 refills | Status: DC
  Filled 2023-02-15: qty 0.5, 1d supply, fill #0

## 2023-02-15 NOTE — Progress Notes (Signed)
67 y.o. Natalie [name] Married White or Caucasian female here for breast and pelvic exam.  I am also following her for h/o PMP status.  Health care maintenance reviewed.  Doesn't have PCP.  Marland Kitchen  Denies vaginal bleeding.  Patient's last menstrual period was 03/05/2007.          Sexually active: No.  H/O STD:  no  Health Maintenance: PCP:  Dr. Junius Roads.  Last wellness appt was last year.  Did blood work at that appt: yes Vaccines are up to date:  pneumonia vaccination discussed Colonoscopy:  01/27/2013.  Pt aware this is due. MMG:  08/21/2022 Negative BMD:  09/13/2020 Last pap smear:  03/19/2018 Negative.   H/o abnormal pap smear:  no    reports that she has never smoked. She has never used smokeless tobacco. She reports current alcohol use. She reports that she does not use drugs.  Past Medical History:  Diagnosis Date   Cervical polyp 2007   Dysrhythmia    PMH : PVC's as a child only; benign   Headache    had only with intolerance to medication   History of anemia    Hx of breast biopsy 2000   Hx of malignant melanoma 02/28/2013   2003 followed by derm.     Left radial fracture 06/2015   Osteopenia    Palpitations    Vitamin D deficiency     Past Surgical History:  Procedure Laterality Date   BREAST LUMPECTOMY  1984   left breast   COLONOSCOPY     OPEN REDUCTION INTERNAL FIXATION (ORIF) WRIST WITH RADIAL BONE GRAFT Left 06/19/2015   Procedure: OPEN REDUCTION INTERNAL FIXATION (ORIF) LEFT WRIST WITH RADIUS AND ULNA WITH ALLOGRAFT BONE GRAFT;  Surgeon: Roseanne Kaufman, MD;  Location: [facility];  Service: Orthopedics;  Laterality: Left;   TONSILLECTOMY  1960   WISDOM TOOTH EXTRACTION      Current Outpatient Medications  Medication Sig Dispense Refill   Ascorbic Acid (VITAMIN C) 1000 MG tablet Take 1,000 mg by mouth daily.     calcium carbonate (TUMS EX) 750 MG chewable tablet Chew 2 tablets by mouth daily.     fluorouracil (EFUDEX) 5 % cream      Multiple Vitamin (MULTIVITAMIN WITH  MINERALS) TABS tablet Take 1 tablet by mouth daily.     OVER THE COUNTER MEDICATION Place 1 drop into both eyes 2 (two) times daily as needed (dry eyes). Over the counter saline eye drops     Vitamin D, Ergocalciferol, (DRISDOL) 1.25 MG (50000 UNIT) CAPS capsule Take 1 capsule (50,000 Units total) by mouth once a week. 12 capsule 4   No current facility-administered medications for this visit.    Family History  Problem Relation Age of Onset   Hypertension Paternal Uncle    Lung cancer Other    Stroke Other    Other Other        mom viral meningitis   Osteoarthritis Other    Alcoholism Other    Breast cancer Paternal Aunt    Breast cancer Other        paternal great aunt    Review of Systems  Constitutional: Negative.   Genitourinary: Negative.     Exam:   BP 118/82 (BP Location: Right Arm, Patient Position: Sitting, Cuff Size: Large)   Pulse (!) 58   Ht 5' 0.35" (1.533 m) Comment: Reported  Wt 172 lb 6.4 oz (78.2 kg)   LMP 03/05/2007   BMI 33.28 kg/m   Height: 5'  0.35" (153.3 cm) (Reported)  General appearance: alert, cooperative and appears stated age Breasts: normal appearance, no masses or tenderness Abdomen: soft, non-tender; bowel sounds normal; no masses,  no organomegaly Lymph nodes: Cervical, supraclavicular, and axillary nodes normal.  No abnormal inguinal nodes palpated Neurologic: Grossly normal  Pelvic: External genitalia:  no lesions              Urethra:  normal appearing urethra with no masses, tenderness or lesions              Bartholins and Skenes: normal                 Vagina: normal appearing vagina with atrophic changes and no discharge, no lesions              Cervix: no lesions              Pap taken: Yes.   Bimanual Exam:  Uterus:  normal size, contour, position, consistency, mobility, non-tender              Adnexa: normal adnexa and no mass, fullness, tenderness               Rectovaginal: Confirms               Anus:  normal sphincter  tone, no lesions  Chaperone, Octaviano Batty, CMA, was present for exam.  Assessment/Plan: 1. Encntr for gyn exam (general) (routine) w/o abn findings - Pap smear obtained today.  Guidelines reviewed. - Mammogram 08/2022 - Colonoscopy due this year.  Pt will call Dr. Collene Mares.  Discussed with her today. - Bone mineral density ordered to do this year - lab work ordered - vaccines reviewed/updated  2. Osteopenia of necks of both femurs - DG BONE DENSITY (DXA); Future  3. Elevated LDL cholesterol level - Comprehensive metabolic panel; Future - Lipid panel; Future - Hemoglobin A1c; Future  4. Vitamin D deficiency - VITAMIN D 25 Hydroxy (Vit-D Deficiency, Fractures); Future  5. Cervical cancer screening - Cytology - PAP( [place])

## 2023-02-15 NOTE — Patient Instructions (Addendum)
Prevnar 20--pneumonia vaccination  Zyrtec '10mg'$  nightly x 7 days.  If this doesn't help.  Try prilosec.

## 2023-02-16 ENCOUNTER — Other Ambulatory Visit (HOSPITAL_BASED_OUTPATIENT_CLINIC_OR_DEPARTMENT_OTHER): Payer: Medicare Other

## 2023-02-16 DIAGNOSIS — E7800 Pure hypercholesterolemia, unspecified: Secondary | ICD-10-CM

## 2023-02-16 DIAGNOSIS — E559 Vitamin D deficiency, unspecified: Secondary | ICD-10-CM

## 2023-02-17 LAB — COMPREHENSIVE METABOLIC PANEL
ALT: 16 IU/L (ref 0–32)
AST: 17 IU/L (ref 0–40)
Albumin/Globulin Ratio: 2 (ref 1.2–2.2)
Albumin: 4.2 g/dL (ref 3.9–4.9)
Alkaline Phosphatase: 88 IU/L (ref 44–121)
BUN/Creatinine Ratio: 20 (ref 12–28)
BUN: 20 mg/dL (ref 8–27)
Bilirubin Total: 0.8 mg/dL (ref 0.0–1.2)
CO2: 21 mmol/L (ref 20–29)
Calcium: 9.7 mg/dL (ref 8.7–10.3)
Chloride: 107 mmol/L — ABNORMAL HIGH (ref 96–106)
Creatinine, Ser: 0.98 mg/dL (ref 0.57–1.00)
Globulin, Total: 2.1 g/dL (ref 1.5–4.5)
Glucose: 87 mg/dL (ref 70–99)
Potassium: 4.1 mmol/L (ref 3.5–5.2)
Sodium: 142 mmol/L (ref 134–144)
Total Protein: 6.3 g/dL (ref 6.0–8.5)
eGFR: 63 mL/min/{1.73_m2} (ref >59)

## 2023-02-17 LAB — HEMOGLOBIN A1C
Est. average glucose Bld gHb Est-mCnc: 103 mg/dL
Hgb A1c MFr Bld: 5.2 % (ref 4.8–5.6)

## 2023-02-17 LAB — LIPID PANEL
Chol/HDL Ratio: 2.4 ratio (ref 0.0–4.4)
Cholesterol, Total: 197 mg/dL (ref 100–199)
HDL: 83 mg/dL (ref >39)
LDL Chol Calc (NIH): 101 mg/dL — ABNORMAL HIGH (ref 0–99)
Triglycerides: 70 mg/dL (ref 0–149)
VLDL Cholesterol Cal: 13 mg/dL (ref 5–40)

## 2023-02-17 LAB — VITAMIN D 25 HYDROXY (VIT D DEFICIENCY, FRACTURES): Vit D, 25-Hydroxy: 29 ng/mL — ABNORMAL LOW (ref 30.0–100.0)

## 2023-02-18 ENCOUNTER — Other Ambulatory Visit (HOSPITAL_BASED_OUTPATIENT_CLINIC_OR_DEPARTMENT_OTHER): Payer: Self-pay | Admitting: Obstetrics & Gynecology

## 2023-02-19 LAB — CYTOLOGY - PAP: Diagnosis: NEGATIVE

## 2023-03-05 ENCOUNTER — Other Ambulatory Visit (HOSPITAL_BASED_OUTPATIENT_CLINIC_OR_DEPARTMENT_OTHER): Payer: Self-pay | Admitting: *Deleted

## 2023-03-05 DIAGNOSIS — M85851 Other specified disorders of bone density and structure, right thigh: Secondary | ICD-10-CM

## 2023-03-23 ENCOUNTER — Ambulatory Visit (INDEPENDENT_AMBULATORY_CARE_PROVIDER_SITE_OTHER): Payer: Medicare Other | Admitting: Obstetrics & Gynecology

## 2023-03-23 DIAGNOSIS — E559 Vitamin D deficiency, unspecified: Secondary | ICD-10-CM

## 2023-03-23 DIAGNOSIS — M818 Other osteoporosis without current pathological fracture: Secondary | ICD-10-CM

## 2023-03-23 MED ORDER — VITAMIN D (ERGOCALCIFEROL) 1.25 MG (50000 UNIT) PO CAPS: 50000.0000 [IU] | ORAL_CAPSULE | ORAL | 3 refills | Status: DC

## 2023-03-23 NOTE — Progress Notes (Unsigned)
Subjective:    60 yrs Married Caucasian G0P0000  female here to discuss recent BMD obtained 02/08/2023 showing osteoporosis with T score of -2.7 in  right femoral neck.  This is change since the prior BMD in 2021.  FRAX score showed increased hip fracture risk of 3.4% over next 10 years.   Potentially modifiable: Tobacco use: no Low body weight (<127 lbs): no Estrogen deficiency with either early menopause (age <45) or bilateral ovariectomy: no Low calcium intake (lifelong): no Alcohol use more than 2 drinks per day: no Recurrent falls: no Inadequate physical activity: no  Current calcium and Vit D intake:  unsure of total calcium intake.  Taking Vit D sporadically.    Past Medical History:  Diagnosis Date   Cervical polyp 2007   Dysrhythmia    PMH : PVC's as a child only; benign   Headache    had only with intolerance to medication   History of anemia    Hx of breast biopsy 2000   Hx of malignant melanoma 02/28/2013   2003 followed by derm.     Left radial fracture 06/2015   Osteopenia    Palpitations    Vitamin D deficiency    Current Outpatient Medications on File Prior to Visit  Medication Sig Dispense Refill   Ascorbic Acid (VITAMIN C) 1000 MG tablet Take 1,000 mg by mouth daily.     calcium carbonate (TUMS EX) 750 MG chewable tablet Chew 2 tablets by mouth daily.     fluorouracil (EFUDEX) 5 % cream      Multiple Vitamin (MULTIVITAMIN WITH MINERALS) TABS tablet Take 1 tablet by mouth daily.     OVER THE COUNTER MEDICATION Place 1 drop into both eyes 2 (two) times daily as needed (dry eyes). Over the counter saline eye drops     pneumococcal 20-valent conjugate vaccine (PREVNAR 20) 0.5 ML injection Inject into the muscle. 0.5 mL 0   No current facility-administered medications on file prior to visit.   Allergies  Allergen Reactions   Doxycycline Other (See Comments)    Tingling in fingers, skin sensitivity   Oxycodone Other (See Comments)    Nausea and headache     Review of Systems Pertinent items noted in HPI and remainder of comprehensive ROS otherwise negative.     Objective:   PHYSICAL EXAM LMP 03/05/2007  General appearance: {general exam:16600}  Imaging Bone Density: Spine T Score: ***, Hip T Score: ***   Done on  FRAX score:  10 year probability of hip fracture: {NUMBER 1-10:22536}%.                        10-year probability of major osteoporotic fractures  is {NUMBER 1-10:22536}%.                                         Assessment:   Osteoporosis with T score -2.7    Plan:   1.  Patient counseled in adequate calcium and vitamin D exposure.  Calcium - 500 - 1000 mg elemental calcium/day in divided doses  Vitamin D - 800 IU/day  Told not to take at same time as bisphosphonate. 2.  Exercise recommended at least 30 minutes 3 times per week.  3.  Recommendation to avoid heavy ETOH use.  4.  Counseled to avoid tobacco and second had smoke. 5.  Fall prevention discussed. 6.  Pharmacologic therapy therapy below discussed including risks and benefits:   Bisphosphonates po (Fosamax, Actonel, Boniva)  Bisphosphonate IV (Reclast)  Evista  Prolia subcutaneous   Forteo subcutaneous  Calcitonin nasal spray  Estrogen/progesterone therapy 7.  Repeat bone density in 2 years.

## 2023-03-24 LAB — PTH, INTACT AND CALCIUM
Calcium: 9.7 mg/dL (ref 8.7–10.3)
PTH: 47 pg/mL (ref 15–65)

## 2023-03-26 ENCOUNTER — Encounter (HOSPITAL_BASED_OUTPATIENT_CLINIC_OR_DEPARTMENT_OTHER): Payer: Self-pay | Admitting: Obstetrics & Gynecology

## 2023-03-26 DIAGNOSIS — M818 Other osteoporosis without current pathological fracture: Secondary | ICD-10-CM | POA: Insufficient documentation

## 2023-05-24 ENCOUNTER — Other Ambulatory Visit: Payer: Self-pay | Admitting: Family Medicine

## 2023-05-24 DIAGNOSIS — Z8249 Family history of ischemic heart disease and other diseases of the circulatory system: Secondary | ICD-10-CM

## 2023-06-21 ENCOUNTER — Ambulatory Visit
Admission: RE | Admit: 2023-06-21 | Discharge: 2023-06-21 | Disposition: A | Discharge status: Home / Self Care | Payer: No Typology Code available for payment source | Source: Ambulatory Visit | Attending: Family Medicine | Admitting: Family Medicine

## 2023-06-21 DIAGNOSIS — Z8249 Family history of ischemic heart disease and other diseases of the circulatory system: Secondary | ICD-10-CM

## 2023-12-13 ENCOUNTER — Encounter (HOSPITAL_BASED_OUTPATIENT_CLINIC_OR_DEPARTMENT_OTHER): Payer: Self-pay

## 2024-02-27 ENCOUNTER — Other Ambulatory Visit (HOSPITAL_BASED_OUTPATIENT_CLINIC_OR_DEPARTMENT_OTHER): Payer: Self-pay | Admitting: Obstetrics & Gynecology

## 2024-02-27 DIAGNOSIS — Z833 Family history of diabetes mellitus: Secondary | ICD-10-CM

## 2024-02-27 DIAGNOSIS — Z6833 Body mass index (BMI) 33.0-33.9, adult: Secondary | ICD-10-CM

## 2024-02-27 DIAGNOSIS — E559 Vitamin D deficiency, unspecified: Secondary | ICD-10-CM

## 2024-02-27 DIAGNOSIS — E7800 Pure hypercholesterolemia, unspecified: Secondary | ICD-10-CM

## 2024-03-06 ENCOUNTER — Ambulatory Visit (HOSPITAL_BASED_OUTPATIENT_CLINIC_OR_DEPARTMENT_OTHER): Payer: Self-pay | Admitting: Obstetrics & Gynecology

## 2024-03-07 ENCOUNTER — Other Ambulatory Visit (HOSPITAL_BASED_OUTPATIENT_CLINIC_OR_DEPARTMENT_OTHER): Payer: Self-pay

## 2024-03-07 DIAGNOSIS — E7800 Pure hypercholesterolemia, unspecified: Secondary | ICD-10-CM

## 2024-03-07 DIAGNOSIS — E559 Vitamin D deficiency, unspecified: Secondary | ICD-10-CM

## 2024-03-08 ENCOUNTER — Encounter (HOSPITAL_BASED_OUTPATIENT_CLINIC_OR_DEPARTMENT_OTHER): Payer: Self-pay | Admitting: Obstetrics & Gynecology

## 2024-03-08 LAB — LIPID PANEL
Chol/HDL Ratio: 2.5 ratio (ref 0.0–4.4)
Cholesterol, Total: 222 mg/dL — ABNORMAL HIGH (ref 100–199)
HDL: 88 mg/dL (ref >39)
LDL Chol Calc (NIH): 121 mg/dL — ABNORMAL HIGH (ref 0–99)
Triglycerides: 75 mg/dL (ref 0–149)
VLDL Cholesterol Cal: 13 mg/dL (ref 5–40)

## 2024-03-08 LAB — VITAMIN D 25 HYDROXY (VIT D DEFICIENCY, FRACTURES): Vit D, 25-Hydroxy: 44.1 ng/mL (ref 30.0–100.0)

## 2024-03-10 ENCOUNTER — Ambulatory Visit (HOSPITAL_BASED_OUTPATIENT_CLINIC_OR_DEPARTMENT_OTHER): Payer: Self-pay | Admitting: Obstetrics & Gynecology

## 2024-03-10 ENCOUNTER — Encounter (HOSPITAL_BASED_OUTPATIENT_CLINIC_OR_DEPARTMENT_OTHER): Payer: Self-pay | Admitting: Obstetrics & Gynecology

## 2024-03-10 VITALS — BP 119/79 | HR 62 | Ht 60.35 in | Wt 173.3 lb

## 2024-03-10 DIAGNOSIS — M818 Other osteoporosis without current pathological fracture: Secondary | ICD-10-CM | POA: Diagnosis not present

## 2024-03-10 DIAGNOSIS — E559 Vitamin D deficiency, unspecified: Secondary | ICD-10-CM

## 2024-03-10 DIAGNOSIS — Z01419 Encounter for gynecological examination (general) (routine) without abnormal findings: Secondary | ICD-10-CM | POA: Diagnosis not present

## 2024-03-10 DIAGNOSIS — R931 Abnormal findings on diagnostic imaging of heart and coronary circulation: Secondary | ICD-10-CM

## 2024-03-10 DIAGNOSIS — Z780 Asymptomatic menopausal state: Secondary | ICD-10-CM

## 2024-03-10 MED ORDER — VITAMIN D (ERGOCALCIFEROL) 1.25 MG (50000 UNIT) PO CAPS: 50000.0000 [IU] | ORAL_CAPSULE | ORAL | 3 refills | Status: AC

## 2024-03-10 NOTE — Progress Notes (Signed)
 Breast and Pelvic  Patient name: Natalie [name] MRN [id number]  Date of birth: [birth year] Chief Complaint:   Breast and Pelvic  History of Present Illness:   Natalie [name] is a 68 y.o. G0P0000 Caucasian female being seen today for a routine annual exam.  She has questions about her coronary calcium score.  This was done 06/2023.  She had calcification in the LAD and this was in the 61st percentile.  She's had some deeper sharp pain over the left chest.  Seems to occur at night and not with exertion.  She is seeing Dr. Prince Rome who was an ortho now doing primary care as a concierge provider.  He recommended folate for Natalie [name].    She also has some questions about difficulty with swallowing pills.  Fish oil is one of the hardest ones for her to swallow.  If she had a pill of the similar size that was not a gel, she would not take it.    Patient's last menstrual period was 03/05/2007.   Last pap 02/2023. Results were: NILM w/ HRHPV negative. H/O abnormal pap: no Last mammogram: 1/72/2024. Results were: normal. Family h/o breast cancer: yes paternal aunts Last colonoscopy: 2024 with Dr. Loreta Ave. Results were: normal. Family h/o colorectal cancer: no     02/15/2023    1:59 PM  Depression screen PHQ 2/9  Decreased Interest 0  Down, Depressed, Hopeless 0  PHQ - 2 Score 0       Review of Systems:   Pertinent items are noted in HPI  Denies any vaginal bleeding, vaginal discharge.  No new breast concerns.  Denies urinary issues.  Denies bowel changes.  Pertinent History Reviewed:  Reviewed past medical,surgical, social and family history.  Reviewed problem list, medications and allergies. Physical Assessment:   Vitals:   03/10/24 1502  BP: 119/79  Pulse: 62  Weight: 173 lb 4.8 oz (78.6 kg)  Height: 5' 0.35" (1.533 m)  Body mass index is 33.45 kg/m.        Physical Examination:   General appearance - well appearing, and in no distress  Mental status - alert, oriented to person,  place, and time  Psych:  She has a normal mood and affect  Skin - warm and dry, normal color, no suspicious lesions noted  Chest - effort normal, all lung fields clear to auscultation bilaterally  Heart - normal rate and regular rhythm  Neck:  midline trachea, no thyromegaly or nodules  Breasts - breasts appear normal, no suspicious masses, no skin or nipple changes or  axillary nodes  Abdomen - soft, nontender, nondistended, no masses or organomegaly  Pelvic - VULVA: normal appearing vulva with no masses, tenderness or lesions    VAGINA: normal appearing vagina with normal color and discharge, no lesions    CERVIX: normal appearing cervix without discharge or lesions, no CMT  Thin prep pap is not done, not indicated this year  UTERUS: uterus is felt to be normal size, shape, consistency and nontender   ADNEXA: No adnexal masses or tenderness noted.  Rectal - normal rectal, good sphincter tone, no masses felt.  Extremities:  No swelling or varicosities noted  Chaperone present for exam  Assessment & Plan:  1. Encntr for gyn exam (general) (routine) w/o abn findings (Primary) - Pap smear 2024 - Mammogram 12/2023 - Colonoscopy 2024 - Bone mineral density 2024, will repeat - lab work done  - vaccines reviewed/updated  2. Elevated coronary artery calcium score - Ambulatory  referral to Cardiology  3. Other osteoporosis without current pathological fracture - will repeat next year  4. Postmenopausal - not on HRT  5. Vitamin D deficiency - taking supplemental Vit D.  Recent Vit D level was 44.   - RF for 50,000 international units weekly.  Rx to pharmacy  #12/3RF.   Meds: No orders of the defined types were placed in this encounter.   Follow-up: Return in about 1 year (around 03/10/2025).  Jerene Bears, MD 03/10/2024 3:55 PM

## 2024-07-18 ENCOUNTER — Ambulatory Visit (HOSPITAL_BASED_OUTPATIENT_CLINIC_OR_DEPARTMENT_OTHER): Admitting: Cardiology

## 2024-07-18 ENCOUNTER — Encounter (HOSPITAL_BASED_OUTPATIENT_CLINIC_OR_DEPARTMENT_OTHER): Payer: Self-pay | Admitting: Cardiology

## 2024-07-18 ENCOUNTER — Other Ambulatory Visit (HOSPITAL_BASED_OUTPATIENT_CLINIC_OR_DEPARTMENT_OTHER): Payer: Self-pay

## 2024-07-18 VITALS — BP 127/85 | HR 60 | Ht 63.0 in | Wt 174.0 lb

## 2024-07-18 DIAGNOSIS — R0789 Other chest pain: Secondary | ICD-10-CM

## 2024-07-18 DIAGNOSIS — E7800 Pure hypercholesterolemia, unspecified: Secondary | ICD-10-CM

## 2024-07-18 DIAGNOSIS — Z712 Person consulting for explanation of examination or test findings: Secondary | ICD-10-CM

## 2024-07-18 DIAGNOSIS — Z8249 Family history of ischemic heart disease and other diseases of the circulatory system: Secondary | ICD-10-CM

## 2024-07-18 DIAGNOSIS — R931 Abnormal findings on diagnostic imaging of heart and coronary circulation: Secondary | ICD-10-CM

## 2024-07-18 DIAGNOSIS — Z7189 Other specified counseling: Secondary | ICD-10-CM

## 2024-07-18 MED ORDER — ROSUVASTATIN CALCIUM 5 MG PO TABS
5.0000 mg | ORAL_TABLET | Freq: Every day | ORAL | 3 refills | Status: DC
  Filled 2024-07-18: qty 90, 90d supply, fill #0

## 2024-07-18 MED ORDER — ASPIRIN 81 MG PO TBEC: 81.0000 mg | DELAYED_RELEASE_TABLET | Freq: Every day | ORAL | Status: AC

## 2024-07-18 NOTE — Progress Notes (Signed)
 Cardiology Office Note:  .   Date:  07/18/2024  ID:  Natalie [name], DOB 1956-01-16, MRN [id number] PCP: Charlett Apolinar POUR, MD  [facility] Providers Cardiologist:  Shelda Bruckner, MD {  History of Present Illness: .   Natalie [name] is a 68 y.o. female with PMH coronary calcification, osteoporosis seen 07/18/24 as a new patient for evaluation of elevated coronary calcium  score.  Today: Referred by Dr. Cleotilde, note from 03/10/24 reviewed. Patient had calcium  score 06/2023, total 22.5, 61st %ile. Has also had intermittent sharp left chest pain. Referred to cardiology for further evaluation.  Remotely had intermittent focal upper chest discomfort at night/bedtime, sharp, localized, does not radiate. Lasts a few minutes. Had a longer episode while she was in [place] for 4 months with study abroad program last fall. Does not occur when she exercises, climbs a mountain. Has not noticed at all in the last few months. No associated symptoms. Does have history of acid reflux, but that was more of a burning in the past.  Cardiovascular risk factors: Prior clinical ASCVD: when she was in her teens/20s, had benign PVCs, did not require treatment Comorbid conditions: Denies hypertension, hyperlipidemia, diabetes, chronic kidney disease  Metabolic syndrome/Obesity: current weight is highest adult weight (BMI 30) Chronic inflammatory conditions: none Tobacco use history: never Family history: father's side has many people with stroke, high blood pressure. Oldest brother had MI in his 84s. Father had CHF, high cholesterol. Mother's brother died of MI in his 7s. Mother's sister had aortic surgery x2, didn't survive second surgery, had MI history as well. Mother had aortic aneurysm that did not require treatment, died age 100. Prior pertinent testing and/or incidental findings: calcium  score 06/2023, total 22.5, 61st %ile  ROS: Denies shortness of breath at rest or with normal exertion. No PND,  orthopnea, LE edema or unexpected weight gain. No syncope or palpitations. ROS otherwise negative except as noted.   Studies Reviewed: SABRA    EKG:  EKG Interpretation Date/Time:  Friday July 18 2024 08:47:58 EDT Ventricular Rate:  56 PR Interval:  148 QRS Duration:  80 QT Interval:  448 QTC Calculation: 432 R Axis:   -4  Text Interpretation: Sinus bradycardia Cannot rule out Anterior infarct , age undetermined When compared with ECG of 06-Dec-2007 03:32, No significant change was found Confirmed by Bruckner Shelda [phone]) on 07/18/2024 9:02:02 AM    Physical Exam:   VS:  BP 127/85   Pulse 60   Ht 5' 3 (1.6 m)   Wt 174 lb (78.9 kg)   LMP 03/05/2007   SpO2 97%   BMI 30.82 kg/m    Wt Readings from Last 3 Encounters:  07/18/24 174 lb (78.9 kg)  03/10/24 173 lb 4.8 oz (78.6 kg)  02/15/23 172 lb 6.4 oz (78.2 kg)    GEN: Well nourished, well developed in no acute distress HEENT: Normal, moist mucous membranes NECK: No JVD CARDIAC: regular rhythm, normal S1 and S2, no rubs or gallops. No murmur. VASCULAR: Radial and DP pulses 2+ bilaterally. No carotid bruits RESPIRATORY:  Clear to auscultation without rales, wheezing or rhonchi  ABDOMEN: Soft, non-tender, non-distended MUSCULOSKELETAL:  Ambulates independently SKIN: Warm and dry, no edema NEUROLOGIC:  Alert and oriented x 3. No focal neuro deficits noted. PSYCHIATRIC:  Normal affect    ASSESSMENT AND PLAN: .    Atypical chest pain -none in recent months. Only at night, nonexertional. History of GERD. Suspect that this is acid reflux, discussed trials of meds if  it recurs -reviewed red flag warning signs that need immediate medical attention  Coronary calcification Family history of heart disease hypercholesterolemia -lipids 03/2024: Tchol 222, TG 75, HDL 88, LDL 121 -We reviewed the calcium  score at length, including images as well as the graph showing mortality based on calcium  score. We discussed the  pathophysiology of cholesterol plaque formation, the role of calcium  and why it is a marker, how plaque is key to acute MI/CVA, and how known plaque is managed with medications.   -we discussed the data on statins, both in terms of their long term benefit as well as the risk of side effects. Reviewed common misconceptions about statins. Reviewed how we monitor treatment. After shared decision making, patient is agreeable to trialing statin. Check lipids and lfts in 2-3 months -discussed guidelines recommending aspirin . After shared decision making, patient is amenable. Discussed watching for signs of bleeding -discussed LDL goal <70 -was told to take folate due to elevated homocysteine. We discussed that clinical trials have not shown significant benefit to decreasing homocysteine levels despite the association between elevated homocysteine and heart disease  CV risk counseling and prevention -recommend heart healthy/Mediterranean diet, with whole grains, fruits, vegetable, fish, lean meats, nuts, and olive oil. Limit salt. -recommend moderate walking, 3-5 times/week for 30-50 minutes each session. Aim for at least 150 minutes.week. Goal should be pace of 3 miles/hours, or walking 1.5 miles in 30 minutes -recommend avoidance of tobacco products. Avoid excess alcohol.  Dispo: 1 year or sooner as needed  Signed, Shelda Bruckner, MD   Shelda Bruckner, MD, PhD, [facility]  [facility]  [place]  [facility] at [facility] [address], [address] [place], [place] [address] [phone]

## 2024-07-18 NOTE — Patient Instructions (Signed)
 Medication Instructions:   Your physician recommends that you continue on your current medications as directed. Please refer to the Current Medication list given to you today.  *If you need a refill on your cardiac medications before your next appointment, please call your pharmacy*   Lab Work:  IN 2-3 MONTHS HERE AT THE LAB IN THIS BUILDING--3RD FLOOR [address]--LIPIDS AND LFTs--PLEASE COME FASTING TO THIS LAB APPOINTMENT  If you have labs (blood work) drawn today and your tests are completely normal, you will receive your results only by: MyChart Message (if you have MyChart) OR A paper copy in the mail If you have any lab test that is abnormal or we need to change your treatment, we will call you to review the results.    Follow-Up: At [facility], you and your health needs are our priority.  As part of our continuing mission to provide you with exceptional heart care, our providers are all part of one team.  This team includes your primary Cardiologist (physician) and Advanced Practice Providers or APPs (Physician Assistants and Nurse Practitioners) who all work together to provide you with the care you need, when you need it.  Your next appointment:   1 year(s)  Provider:   Shelda Bruckner, MD

## 2024-08-20 ENCOUNTER — Telehealth: Payer: Self-pay

## 2024-08-20 NOTE — Telephone Encounter (Signed)
   Pre-operative Risk Assessment    Patient Name: Natalie [name]  DOB: 16-Aug-1956 MRN: [id number] Date of last office visit: 07/18/24 Date of next office visit: none Procedure:  Right Total Knee Arthroplasty Date of Surgery:  Clearance 10/29/24                                Surgeon:  Dr. Dempsey Moan Surgeon's Group or Practice Name:  [facility] Phone number:  [phone]  [facility] Fax number:  [phone] Type of Clearance Requested:   - Medical  - Pharmacy:  Hold Aspirin      Type of Anesthesia:  Choice   Additional requests/questions:    [name]   08/20/2024, 3:03 PM

## 2024-09-05 NOTE — Telephone Encounter (Signed)
 Dr.Christopher is out for surgery.  Please send to DOD for clearance

## 2024-09-05 NOTE — Telephone Encounter (Signed)
 This was sent to preop callback. Dr. Lonni is in the office. See previous notes

## 2024-09-09 ENCOUNTER — Encounter (HOSPITAL_BASED_OUTPATIENT_CLINIC_OR_DEPARTMENT_OTHER): Payer: Self-pay

## 2024-09-09 NOTE — Telephone Encounter (Signed)
 Surgeon office sent a duplicate inquiring if pt has been cleared. See notes from Jackee Alberts, NP  that he has reached out to MD.   I will resend to preop APP as well.

## 2024-09-17 ENCOUNTER — Telehealth: Payer: Self-pay

## 2024-09-17 NOTE — Telephone Encounter (Signed)
 Last visit: 01/17/2019. Okay pr provider to set appt for surgical clearance. Reach out to pt. Pt states she is trying to schedule an appt with her cardiology for the clearance but provider is out for vacation currently. She continues if she has appt with them she will cancel the appt with Dr. Charlett. Inform pt,  usually the clearance requires from PCP as well but pt can contact [facility] to be sure. Pt verbalized understanding.  Set surgical clearance for 10/08/2024 with Dr Charlett.

## 2024-09-19 ENCOUNTER — Other Ambulatory Visit (HOSPITAL_BASED_OUTPATIENT_CLINIC_OR_DEPARTMENT_OTHER): Payer: Self-pay

## 2024-09-19 MED ORDER — FLUZONE HIGH-DOSE 0.5 ML IM SUSY
0.5000 mL | PREFILLED_SYRINGE | Freq: Once | INTRAMUSCULAR | 0 refills | Status: AC
  Filled 2024-09-19: qty 0.5, 1d supply, fill #0

## 2024-09-19 MED ORDER — COMIRNATY 30 MCG/0.3ML IM SUSY
0.3000 mL | PREFILLED_SYRINGE | Freq: Once | INTRAMUSCULAR | 0 refills | Status: AC
  Filled 2024-09-19: qty 0.3, 1d supply, fill #0

## 2024-09-23 ENCOUNTER — Telehealth (HOSPITAL_BASED_OUTPATIENT_CLINIC_OR_DEPARTMENT_OTHER): Payer: Self-pay | Admitting: *Deleted

## 2024-09-23 NOTE — Telephone Encounter (Signed)
 Pt has been schedule tele preop appt 10/07/24. Med rec and consent are done.     Patient Consent for Virtual Visit        Natalie [name] has provided verbal consent on 09/23/2024 for a virtual visit (video or telephone).   CONSENT FOR VIRTUAL VISIT FOR:  Natalie [name]  By participating in this virtual visit I agree to the following:  I hereby voluntarily request, consent and authorize [facility] and its employed or contracted physicians, physician assistants, nurse practitioners or other licensed health care professionals (the Practitioner), to provide me with telemedicine health care services (the "Services) as deemed necessary by the treating Practitioner. I acknowledge and consent to receive the Services by the Practitioner via telemedicine. I understand that the telemedicine visit will involve communicating with the Practitioner through live audiovisual communication technology and the disclosure of certain medical information by electronic transmission. I acknowledge that I have been given the opportunity to request an in-person assessment or other available alternative prior to the telemedicine visit and am voluntarily participating in the telemedicine visit.  I understand that I have the right to withhold or withdraw my consent to the use of telemedicine in the course of my care at any time, without affecting my right to future care or treatment, and that the Practitioner or I may terminate the telemedicine visit at any time. I understand that I have the right to inspect all information obtained and/or recorded in the course of the telemedicine visit and may receive copies of available information for a reasonable fee.  I understand that some of the potential risks of receiving the Services via telemedicine include:  Delay or interruption in medical evaluation due to technological equipment failure or disruption; Information transmitted may not be sufficient (e.g. poor resolution  of images) to allow for appropriate medical decision making by the Practitioner; and/or  In rare instances, security protocols could fail, causing a breach of personal health information.  Furthermore, I acknowledge that it is my responsibility to provide information about my medical history, conditions and care that is complete and accurate to the best of my ability. I acknowledge that Practitioner's advice, recommendations, and/or decision may be based on factors not within their control, such as incomplete or inaccurate data provided by me or distortions of diagnostic images or specimens that may result from electronic transmissions. I understand that the practice of medicine is not an exact science and that Practitioner makes no warranties or guarantees regarding treatment outcomes. I acknowledge that a copy of this consent can be made available to me via my patient portal [facility] MyChart), or I can request a printed copy by calling the office of [facility].    I understand that my insurance will be billed for this visit.   I have read or had this consent read to me. I understand the contents of this consent, which adequately explains the benefits and risks of the Services being provided via telemedicine.  I have been provided ample opportunity to ask questions regarding this consent and the Services and have had my questions answered to my satisfaction. I give my informed consent for the services to be provided through the use of telemedicine in my medical care

## 2024-09-23 NOTE — Telephone Encounter (Signed)
 Left message to call back to schedule tele pre op appt.

## 2024-09-23 NOTE — Telephone Encounter (Signed)
 Pt has been schedule tele preop appt 10/07/24. Med rec and consent are done.

## 2024-09-23 NOTE — Telephone Encounter (Signed)
   Name: Natalie [name]  DOB: [birth year]  MRN: [id number]  Primary Cardiologist: Shelda Bruckner, MD  Preoperative team, please contact this patient and set up a phone call appointment for further preoperative risk assessment. Please obtain consent and complete medication review. Thank you for your help.  I confirm that guidance regarding antiplatelet and oral anticoagulation therapy has been completed and, if necessary, noted below.  Regarding ASA therapy, we recommend continuation of ASA throughout the perioperative period.  However, if the surgeon feels that cessation of ASA is required in the perioperative period, it may be stopped 5-7 days prior to surgery with a plan to resume it as soon as felt to be feasible from a surgical standpoint in the post-operative period.   I also confirmed the patient resides in the state of [place] . As per [facility] Medical Board telemedicine laws, the patient must reside in the state in which the provider is licensed.   Natalie Cardosa D Kayron Kalmar, NP 09/23/2024, 8:02 AM [facility]

## 2024-10-07 ENCOUNTER — Ambulatory Visit: Attending: Cardiology | Admitting: Cardiology

## 2024-10-07 DIAGNOSIS — Z01818 Encounter for other preprocedural examination: Secondary | ICD-10-CM

## 2024-10-07 DIAGNOSIS — Z01810 Encounter for preprocedural cardiovascular examination: Secondary | ICD-10-CM | POA: Diagnosis not present

## 2024-10-07 NOTE — Progress Notes (Signed)
 Virtual Visit via Telephone Note   Because of Natalie [name] co-morbid illnesses, she is at least at moderate risk for complications without adequate follow up.  This format is felt to be most appropriate for this patient at this time.  Due to technical limitations with video connection (technology), today's appointment will be conducted as an audio only telehealth visit, and Natalie [name] verbally agreed to proceed in this manner.   All issues noted in this document were discussed and addressed.  No physical exam could be performed with this format.  Evaluation Performed:  Preoperative cardiovascular risk assessment _____________   Date:  10/07/2024   Patient ID:  Natalie [name], DOB August 25, 1956, MRN [id number] Patient Location:  Home Provider location:   Office  Primary Care Provider:  Charlett Apolinar POUR, MD Primary Cardiologist:  Shelda Bruckner, MD  Chief Complaint / Patient Profile   68 y.o. y/o female with a h/o  who is pending right total knee arthroplasty and presents today for telephonic preoperative cardiovascular risk assessment.  History of Present Illness    Natalie [name] is a 68 y.o. female who presents via audio/video conferencing for a telehealth visit today.  Pt was last seen in cardiology clinic on 07/18/2024 by Dr. Dawna Bruckner.  At that time Natalie [name] was doing well, bothered by episodes of atypical chest pain, able to be very physically active, no changes made to medications or plan of care she was advised to follow-up in 1 year.  The patient is now pending procedure as outlined above. Since her last visit, she continues to be physically active, no formal complaints from a cardiac perspective. She denies chest pain, palpitations, dyspnea, pnd, orthopnea, n, v, dizziness, syncope, edema, weight gain, or early satiety.        Past Medical History:  Diagnosis Date   Cervical polyp 2007   Dysrhythmia    PMH : PVC's as a child only; benign    Headache    had only with intolerance to medication   History of anemia    Hx of breast biopsy 2000   Hx of malignant melanoma 02/28/2013   2003 followed by derm.     Left radial fracture 06/2015   Osteopenia    Palpitations    Vitamin D  deficiency    Past Surgical History:  Procedure Laterality Date   BREAST LUMPECTOMY  1984   left breast   COLONOSCOPY     OPEN REDUCTION INTERNAL FIXATION (ORIF) WRIST WITH RADIAL BONE GRAFT Left 06/19/2015   Procedure: OPEN REDUCTION INTERNAL FIXATION (ORIF) LEFT WRIST WITH RADIUS AND ULNA WITH ALLOGRAFT BONE GRAFT;  Surgeon: Elsie Mussel, MD;  Location: [facility] OR;  Service: Orthopedics;  Laterality: Left;   TONSILLECTOMY  1960   WISDOM TOOTH EXTRACTION      Allergies  Allergies  Allergen Reactions   Doxycycline  Other (See Comments)    Tingling in fingers, skin sensitivity   Oxycodone  Other (See Comments)    Nausea and headache    Home Medications    Prior to Admission medications   Medication Sig Start Date End Date Taking? Authorizing Provider  aspirin  EC 81 MG tablet Take 1 tablet (81 mg total) by mouth daily. Swallow whole. 07/18/24   Bruckner Shelda, MD  FOLIC ACID PO Take 1 tablet by mouth daily.    [provider]  Misc Natural Products (JOINT SUPPORT) CAPS Take 2 capsules by mouth daily.    [provider]  Omega-3 Fatty Acids (FISH  OIL PO) Take 2 capsules by mouth daily.    [provider]  rosuvastatin  (CRESTOR ) 5 MG tablet Take 1 tablet (5 mg total) by mouth daily. 07/18/24 10/16/24  Lonni Slain, MD  UNABLE TO FIND Med Name: mobilate; topical gel used for joint pain    [provider]  Vitamin D , Ergocalciferol , (DRISDOL ) 1.25 MG (50000 UNIT) CAPS capsule Take 1 capsule (50,000 Units total) by mouth once a week. 03/10/24   Cleotilde Ronal RAMAN, MD    Physical Exam    Vital Signs:  Natalie [name] does not have vital signs available for review today.  Given telephonic nature of  communication, physical exam is limited. AAOx3. NAD. Normal affect.  Speech and respirations are unlabored.  Accessory Clinical Findings    None  Assessment & Plan    1.  Preoperative Cardiovascular Risk Assessment: According to the Revised Cardiac Risk Index (RCRI), her Perioperative Risk of Major Cardiac Event is (%): 0.4 Her Functional Capacity in METs is: 5.07 according to the Duke Activity Status Index (DASI). Therefore, based on ACC/AHA guidelines, patient would be at acceptable risk for the planned procedure without further cardiovascular testing. I will route this recommendation to the requesting party via Epic fax function.   The patient was advised that if she develops new symptoms prior to surgery to contact our office to arrange for a follow-up visit, and she verbalized understanding.  Regarding ASA therapy, we recommend continuation of ASA throughout the perioperative period. However, if the surgeon feels that cessation of ASA is required in the perioperative period, it may be stopped 5-7 days prior to surgery with a plan to resume it as soon as felt to be feasible from a surgical standpoint in the post-operative period.   A copy of this note will be routed to requesting surgeon.  Time:   Today, I have spent 10 minutes with the patient with telehealth technology discussing medical history, symptoms, and management plan.     Delon JAYSON Hoover, NP  10/07/2024, 9:31 AM

## 2024-10-08 ENCOUNTER — Ambulatory Visit (INDEPENDENT_AMBULATORY_CARE_PROVIDER_SITE_OTHER): Admitting: Internal Medicine

## 2024-10-08 ENCOUNTER — Encounter: Payer: Self-pay | Admitting: Internal Medicine

## 2024-10-08 VITALS — BP 130/82 | HR 55 | Temp 97.4°F | Ht 63.0 in | Wt 173.8 lb

## 2024-10-08 DIAGNOSIS — Z01818 Encounter for other preprocedural examination: Secondary | ICD-10-CM

## 2024-10-08 DIAGNOSIS — Z79899 Other long term (current) drug therapy: Secondary | ICD-10-CM

## 2024-10-08 DIAGNOSIS — E785 Hyperlipidemia, unspecified: Secondary | ICD-10-CM | POA: Diagnosis not present

## 2024-10-08 DIAGNOSIS — M1711 Unilateral primary osteoarthritis, right knee: Secondary | ICD-10-CM

## 2024-10-08 LAB — HEPATIC FUNCTION PANEL
ALT: 16 U/L (ref 0–35)
AST: 22 U/L (ref 0–37)
Albumin: 4.4 g/dL (ref 3.5–5.2)
Alkaline Phosphatase: 65 U/L (ref 39–117)
Bilirubin, Direct: 0.1 mg/dL (ref 0.0–0.3)
Total Bilirubin: 0.6 mg/dL (ref 0.2–1.2)
Total Protein: 6.9 g/dL (ref 6.0–8.3)

## 2024-10-08 LAB — BASIC METABOLIC PANEL WITH GFR
BUN: 17 mg/dL (ref 6–23)
CO2: 28 meq/L (ref 19–32)
Calcium: 9.7 mg/dL (ref 8.4–10.5)
Chloride: 104 meq/L (ref 96–112)
Creatinine, Ser: 0.75 mg/dL (ref 0.40–1.20)
GFR: 81.42 mL/min (ref >60.00)
Glucose, Bld: 84 mg/dL (ref 70–99)
Potassium: 4.4 meq/L (ref 3.5–5.1)
Sodium: 138 meq/L (ref 135–145)

## 2024-10-08 LAB — CBC WITH DIFFERENTIAL/PLATELET
Basophils Absolute: 0.1 K/uL (ref 0.0–0.1)
Basophils Relative: 1.3 % (ref 0.0–3.0)
Eosinophils Absolute: 0.1 K/uL (ref 0.0–0.7)
Eosinophils Relative: 1.4 % (ref 0.0–5.0)
HCT: 41 % (ref 36.0–46.0)
Hemoglobin: 13.7 g/dL (ref 12.0–15.0)
Lymphocytes Relative: 29.5 % (ref 12.0–46.0)
Lymphs Abs: 1.5 K/uL (ref 0.7–4.0)
MCHC: 33.3 g/dL (ref 30.0–36.0)
MCV: 88.1 fl (ref 78.0–100.0)
Monocytes Absolute: 0.4 K/uL (ref 0.1–1.0)
Monocytes Relative: 7.9 % (ref 3.0–12.0)
Neutro Abs: 3.1 K/uL (ref 1.4–7.7)
Neutrophils Relative %: 59.9 % (ref 43.0–77.0)
Platelets: 256 K/uL (ref 150.0–400.0)
RBC: 4.65 Mil/uL (ref 3.87–5.11)
RDW: 12.9 % (ref 11.5–15.5)
WBC: 5.1 K/uL (ref 4.0–10.5)

## 2024-10-08 LAB — LIPID PANEL
Cholesterol: 161 mg/dL (ref 0–200)
HDL: 80.1 mg/dL (ref >39.00)
LDL Cholesterol: 64 mg/dL (ref 0–99)
NonHDL: 81.12
Total CHOL/HDL Ratio: 2
Triglycerides: 87 mg/dL (ref 0.0–149.0)
VLDL: 17.4 mg/dL (ref 0.0–40.0)

## 2024-10-08 LAB — HEMOGLOBIN A1C: Hgb A1c MFr Bld: 4.8 % (ref 4.6–6.5)

## 2024-10-08 NOTE — Patient Instructions (Signed)
 Good to see you today   Will send info  about optimization for surgery .

## 2024-10-08 NOTE — Progress Notes (Signed)
 Chief Complaint  Patient presents with   Pre-op Exam    HPI: Patient  Natalie [name]  68 y.o. comes in today for pre operative  evaluation for r TKA .  Dr Alusio for Nov 26/25. Takes statin  for cv risk but no active cv disease.   No bleeding neurologic sx   events.  No active respiratory disease.   Activity  level good  Takes high dose vit d for deficiency  on going and has had in range levels  Has had cardiology assessment  and stable for surgery.   Health Maintenance  Topic Date Due   Medicare Annual Wellness (AWV)  Never done   DTaP/Tdap/Td (3 - Td or Tdap) 12/07/2024   COVID-19 Vaccine (4 - 2025-26 season) 03/20/2025   Mammogram  12/10/2025   Colonoscopy  06/05/2033   Pneumococcal Vaccine: 50+ Years  Completed   Influenza Vaccine  Completed   DEXA SCAN  Completed   Hepatitis C Screening  Completed   Zoster Vaccines- Shingrix  Completed   Meningococcal B Vaccine  Aged Out      ROS:  GEN/ HEENT: No fever, significant weight changes sweats headaches vision problems hearing changes, CV/ PULM; No chest pain shortness of breath cough, syncope,edema  change in exercise tolerance. GI /GU: No adominal pain, vomiting, change in bowel habits. No blood in the stool. No significant GU symptoms. SKIN/HEME: ,no acute skin rashes suspicious lesions or bleeding. No lymphadenopathy, nodules, masses.  NEURO/ PSYCH:  No neurologic signs such as weakness numbness. No depression anxiety. IMM/ Allergy: No unusual infections.  Allergy .   REST of 12 system review negative except as per HPI   Past Medical History:  Diagnosis Date   Cervical polyp 2007   Dysrhythmia    PMH : PVC's as a child only; benign   Headache    had only with intolerance to medication   History of anemia    Hx of breast biopsy 2000   Hx of malignant melanoma 02/28/2013   2003 followed by derm.     Left radial fracture 06/2015   Osteopenia    Palpitations    Vitamin D  deficiency     Past Surgical History:   Procedure Laterality Date   BREAST LUMPECTOMY  1984   left breast   COLONOSCOPY     OPEN REDUCTION INTERNAL FIXATION (ORIF) WRIST WITH RADIAL BONE GRAFT Left 06/19/2015   Procedure: OPEN REDUCTION INTERNAL FIXATION (ORIF) LEFT WRIST WITH RADIUS AND ULNA WITH ALLOGRAFT BONE GRAFT;  Surgeon: Elsie Mussel, MD;  Location: [facility] OR;  Service: Orthopedics;  Laterality: Left;   TONSILLECTOMY  1960   WISDOM TOOTH EXTRACTION      Family History  Problem Relation Age of Onset   Hypertension Paternal Uncle    Lung cancer Other    Stroke Other    Other Other        mom viral meningitis   Osteoarthritis Other    Alcoholism Other    Breast cancer Paternal Aunt    Breast cancer Other        paternal great aunt    Social History   Socioeconomic History   Marital status: Married    Spouse name: Not on file   Number of children: Not on file   Years of education: Not on file   Highest education level: Doctorate  Occupational History   Not on file  Tobacco Use   Smoking status: Never   Smokeless tobacco: Never  Vaping  Use   Vaping status: Never Used  Substance and Sexual Activity   Alcohol use: Yes    Alcohol/week: 0.0 - 2.0 standard drinks of alcohol   Drug use: No   Sexual activity: Not Currently    Birth control/protection: Post-menopausal  Other Topics Concern   Not on file  Social History Narrative   hh of 2    Neg td    PHD sports and exercise science    Works as Physical therapist    Has been PE teacher    Social Drivers of Health   Financial Resource Strain: Patient Declined (10/07/2024)   Overall Financial Resource Strain (CARDIA)    Difficulty of Paying Living Expenses: Patient declined  Food Insecurity: Patient Declined (10/07/2024)   Hunger Vital Sign    Worried About Running Out of Food in the Last Year: Patient declined    Ran Out of Food in the Last Year: Patient declined  Transportation Needs: No Transportation Needs (10/07/2024)   PRAPARE - Therapist, Art (Medical): No    Lack of Transportation (Non-Medical): No  Physical Activity: Sufficiently Active (10/07/2024)   Exercise Vital Sign    Days of Exercise per Week: 4 days    Minutes of Exercise per Session: 60 min  Stress: No Stress Concern Present (10/07/2024)   [employer] of Occupational Health - Occupational Stress Questionnaire    Feeling of Stress: Not at all  Social Connections: Moderately Integrated (10/07/2024)   Social Connection and Isolation Panel    Frequency of Communication with Friends and Family: More than three times a week    Frequency of Social Gatherings with Friends and Family: Twice a week    Attends Religious Services: Patient declined    Database Administrator or Organizations: Yes    Attends Banker Meetings: 1 to 4 times per year    Marital Status: Married    Outpatient Medications Prior to Visit  Medication Sig Dispense Refill   aspirin  EC 81 MG tablet Take 1 tablet (81 mg total) by mouth daily. Swallow whole.     FOLIC ACID PO Take 1 tablet by mouth daily.     Misc Natural Products (JOINT SUPPORT) CAPS Take 2 capsules by mouth daily.     Omega-3 Fatty Acids (FISH OIL PO) Take 2 capsules by mouth daily.     rosuvastatin  (CRESTOR ) 5 MG tablet Take 1 tablet (5 mg total) by mouth daily. 90 tablet 3   UNABLE TO FIND Med Name: mobilate; topical gel used for joint pain     Vitamin D , Ergocalciferol , (DRISDOL ) 1.25 MG (50000 UNIT) CAPS capsule Take 1 capsule (50,000 Units total) by mouth once a week. 12 capsule 3   No facility-administered medications prior to visit.     EXAM:  BP 130/82 (BP Location: Left Arm, Patient Position: Sitting, Cuff Size: Large)   Pulse (!) 55   Temp (!) 97.4 F (36.3 C) (Oral)   Ht 5' 3 (1.6 m)   Wt 173 lb 12.8 oz (78.8 kg)   LMP 03/05/2007   SpO2 98%   BMI 30.79 kg/m   Body mass index is 30.79 kg/m. Wt Readings from Last 3 Encounters:  10/08/24 173 lb 12.8 oz (78.8 kg)  07/18/24  174 lb (78.9 kg)  03/10/24 173 lb 4.8 oz (78.6 kg)    Physical Exam: Vital signs reviewed HZW:Uypd is a well-developed well-nourished alert cooperative    who appearsr stated age in no acute distress.  HEENT: normocephalic atraumatic , Eyes: PERRL EOM's full, conjunctiva clear,  NECK: supple without masses, thyromegaly or bruits. CHEST/PULM:  Clear to auscultation and percussion breath sounds equal no wheeze , rales or rhonchi. No chest wall deformities or tenderness.. CV: PMI is nondisplaced, S1 S2 no gallops, murmurs, rubs. Peripheral pulses are full without delay.No JVD .  ABDOMEN: Bowel sounds normal nontender  No guard or rebound, no hepato splenomegal no CVA tenderness.   Extremtities:  No clubbing cyanosis or edema, no acute joint swelling or redness no focal atrophy. Some hypertrohpy r knee  NEURO:  Oriented x3, cranial nerves 3-12 appear to be intact, no obvious focal weakness,gait within normal limits  SKIN: No acute rashes normal turgor, color, no bruising or petechiae. PSYCH: Oriented, good eye contact, no obvious depression anxiety, cognition and judgment appear normal. LN: no cervical axillary adenopathy  Lab Results  Component Value Date   WBC 4.5 08/06/2019   HGB 13.8 08/06/2019   HCT 40.5 08/06/2019   PLT 229 08/06/2019   GLUCOSE 87 02/16/2023   CHOL 222 (H) 03/07/2024   TRIG 75 03/07/2024   HDL 88 03/07/2024   LDLDIRECT 104.0 05/30/2010   LDLCALC 121 (H) 03/07/2024   ALT 16 02/16/2023   AST 17 02/16/2023   NA 142 02/16/2023   K 4.1 02/16/2023   CL 107 (H) 02/16/2023   CREATININE 0.98 02/16/2023   BUN 20 02/16/2023   CO2 21 02/16/2023   TSH 2.580 08/06/2019   HGBA1C 5.2 02/16/2023    BP Readings from Last 3 Encounters:  10/08/24 130/82  07/18/24 127/85  03/10/24 119/79   Last vitamin D  Lab Results  Component Value Date   VD25OH 44.1 03/07/2024    Lab plan   reviewed with patient   ASSESSMENT AND PLAN:  Discussed the following assessment and  plan:    ICD-10-CM   1. Arthritis of knee, right  M17.11 Basic metabolic panel with GFR    Lipid panel    Hepatic function panel    CBC with Differential/Platelet    Hemoglobin A1c    TSH    2. Pre-operative clearance  Z01.818 Basic metabolic panel with GFR    Lipid panel    Hepatic function panel    CBC with Differential/Platelet    Hemoglobin A1c    TSH    3. Hyperlipidemia, unspecified hyperlipidemia type  E78.5 Basic metabolic panel with GFR    Lipid panel    Hepatic function panel    CBC with Differential/Platelet    Hemoglobin A1c    TSH    4. Medication management  Z79.899 Basic metabolic panel with GFR    Lipid panel    Hepatic function panel    CBC with Differential/Platelet    Hemoglobin A1c    TSH    Family hx dm Update metabo;ic testing  Lab for preop pending  Lipid surveillance  now on statin.  Optimized for surgery . Will get  info  to Dr Hiram office  Return for depending on results, as indicated.  Patient Care Team: Kennidy Lamke, Apolinar POUR, MD as PCP - General (Internal Medicine) Lonni Slain, MD as PCP - Cardiology (Cardiology) Cleotilde Ronal RAMAN, MD as Consulting Physician (Gynecology) Patient Instructions  Good to see you today   Will send info  about optimization for surgery .   Johan Creveling K. Shaila Gilchrest M.D.

## 2024-10-09 ENCOUNTER — Ambulatory Visit: Payer: Self-pay | Admitting: Internal Medicine

## 2024-10-09 LAB — TSH: TSH: 1.39 u[IU]/mL (ref 0.35–5.50)

## 2024-10-09 NOTE — Progress Notes (Signed)
 Ldl 64  rest of labs also  in range  ,will forward to  cards and ortho teams.

## 2024-10-14 ENCOUNTER — Ambulatory Visit: Admitting: Family Medicine

## 2024-10-14 ENCOUNTER — Encounter: Payer: Self-pay | Admitting: Family Medicine

## 2024-10-14 VITALS — BP 108/70 | HR 78 | Temp 97.0°F | Ht 63.0 in | Wt 174.4 lb

## 2024-10-14 DIAGNOSIS — E7800 Pure hypercholesterolemia, unspecified: Secondary | ICD-10-CM | POA: Diagnosis not present

## 2024-10-14 DIAGNOSIS — Z85820 Personal history of malignant melanoma of skin: Secondary | ICD-10-CM | POA: Diagnosis not present

## 2024-10-14 NOTE — Patient Instructions (Addendum)
 It was very nice to see you today!  Happy Holidays.   Good luck with surgery   PLEASE NOTE:  If you had any lab tests please let us  know if you have not heard back within a few days. You may see your results on MyChart before we have a chance to review them but we will give you a call once they are reviewed by us . If we ordered any referrals today, please let us  know if you have not heard from their office within the next week.   Please try these tips to maintain a healthy lifestyle:  Eat most of your calories during the day when you are active. Eliminate processed foods including packaged sweets (pies, cakes, cookies), reduce intake of potatoes, white bread, white pasta, and white rice. Look for whole grain options, oat flour or almond flour.  Each meal should contain half fruits/vegetables, one quarter protein, and one quarter carbs (no bigger than a computer mouse).  Cut down on sweet beverages. This includes juice, soda, and sweet tea. Also watch fruit intake, though this is a healthier sweet option, it still contains natural sugar! Limit to 3 servings daily.  Drink at least 1 glass of water with each meal and aim for at least 8 glasses per day  Exercise at least 150 minutes every week.

## 2024-10-14 NOTE — Progress Notes (Signed)
 Subjective:     Patient ID: Natalie [name], female    DOB: 05-17-1956, 68 y.o.   MRN: [id number]  Chief Complaint  Patient presents with   Establish Care    Pt here to change doctors    Discussed the use of AI scribe software for clinical note transcription with the patient, who gave verbal consent to proceed.  History of Present Illness Natalie [name] is a 68 year old female who presents for a new patient visit to establish care with a new primary care physician.  She is transitioning her primary care to a new physician after previously seeing Dr. Paulla and Dr. Leticia. She continues to see Dr. Cleotilde for her gynecological needs.  She has a history of melanoma with a spot removed from her leg around the year 2000 and continues to follow up with dermatology. She also has a history of anemia. Her surgical history includes tonsillectomy, removal of a left breast lump, and wrist fracture repair.  She experiences occasional sharp chest pains, typically at night when lying down, which resolve within minutes. These episodes were more persistent during a trip to [place] in 2024. She reports no major headaches, syncope, palpitations, cough, dyspnea, vomiting, diarrhea, urinary issues, depression, or suicidal ideation. She does experience minor dizziness and occasional constipation, which is managed with Senna, Metamucil, or Miralax, and anticipates needing these post-surgery due to narcotic use.  She is preparing for knee replacement surgery and has completed surgical clearance. She engages in physical therapy and remains active through yard work, biking, and hiking.  She takes rosuvastatin  and has a family history of cardiovascular disease and stroke and pt w/elevated calcium  score in LAD-sees Card.    Health Maintenance Due  Topic Date Due   Medicare Annual Wellness (AWV)  Never done    Past Medical History:  Diagnosis Date   Cervical polyp 2007   Dysrhythmia    PMH : PVC's as a child  only; benign   Headache    had only with intolerance to medication   History of anemia    Hx of breast biopsy 2000   Hx of malignant melanoma 02/28/2013   2003 followed by derm.     Left radial fracture 06/2015   Osteopenia    Palpitations    Vitamin D  deficiency     Past Surgical History:  Procedure Laterality Date   BREAST LUMPECTOMY  1984   left breast   COLONOSCOPY     OPEN REDUCTION INTERNAL FIXATION (ORIF) WRIST WITH RADIAL BONE GRAFT Left 06/19/2015   Procedure: OPEN REDUCTION INTERNAL FIXATION (ORIF) LEFT WRIST WITH RADIUS AND ULNA WITH ALLOGRAFT BONE GRAFT;  Surgeon: Elsie Mussel, MD;  Location: [facility] OR;  Service: Orthopedics;  Laterality: Left;   TONSILLECTOMY  1960   WISDOM TOOTH EXTRACTION       Current Outpatient Medications:    aspirin  EC 81 MG tablet, Take 1 tablet (81 mg total) by mouth daily. Swallow whole., Disp: , Rfl:    FOLIC ACID PO, Take 1 tablet by mouth daily., Disp: , Rfl:    Misc Natural Products (JOINT SUPPORT) CAPS, Take 2 capsules by mouth daily., Disp: , Rfl:    Omega-3 Fatty Acids (FISH OIL PO), Take 2 capsules by mouth daily., Disp: , Rfl:    rosuvastatin  (CRESTOR ) 5 MG tablet, Take 1 tablet (5 mg total) by mouth daily., Disp: 90 tablet, Rfl: 3   Vitamin D , Ergocalciferol , (DRISDOL ) 1.25 MG (50000 UNIT) CAPS capsule, Take 1 capsule (  50,000 Units total) by mouth once a week., Disp: 12 capsule, Rfl: 3  Allergies  Allergen Reactions   Doxycycline  Other (See Comments)    Tingling in fingers, skin sensitivity   Oxycodone  Other (See Comments)    Nausea and headache   ROS neg/noncontributory except as noted HPI/below      Objective:     BP 108/70 (BP Location: Left Arm, Patient Position: Sitting)   Pulse 78   Temp (!) 97 F (36.1 C) (Temporal)   Ht 5' 3 (1.6 m)   Wt 174 lb 6 oz (79.1 kg)   LMP 03/05/2007   SpO2 98%   BMI 30.89 kg/m  Wt Readings from Last 3 Encounters:  10/14/24 174 lb 6 oz (79.1 kg)  10/08/24 173 lb 12.8 oz (78.8 kg)   07/18/24 174 lb (78.9 kg)    Physical Exam GENERAL: Well developed, well nourished, no acute distress. HEAD EYES EARS NOSE THROAT: Normocephalic, atraumatic, conjunctiva not injected, sclera nonicteric. CARDIAC: Regular rate and rhythm, S1 S2 present, no murmur, dorsalis pedis 2 plus bilaterally, heart normal. NECK: Supple, no thyromegaly, no nodes, no carotid bruits. LUNGS: Clear to auscultation bilaterally, no wheezes. ABDOMEN: Bowel sounds present, soft, non-tender, non-distended, no hepatosplenomegaly, no masses. EXTREMITIES: No edema. MUSCULOSKELETAL: No gross abnormalities. NEUROLOGICAL: Alert and oriented x3, cranial nerves II through XII intact. PSYCHIATRIC: Normal mood, good eye contact.   Reviewed labs, CT score    Assessment & Plan:  Hx of malignant melanoma  Pure hypercholesterolemia    Assessment and Plan Assessment & Plan Hyperlipidemia   Managed with rosuvastatin , her LDL levels have decreased from 121 mg/dL in April to 64 mg/dL, indicating effective management. The goal is to maintain LDL below 80 mg/dL due to family history of cardiovascular disease and stroke. The current low-dose rosuvastatin  regimen is well-tolerated with no significant side effects. Continue rosuvastatin  at the current dose.  Constipation   Intermittent constipation is managed with Senna, Metamucil, or Miralax as needed. The preoperative plan includes daily use of Miralax and Colace to prevent constipation due to narcotic use post-knee surgery. Use Miralax and Colace daily post-surgery to prevent constipation from narcotics and ensure adequate hydration.  History of melanoma of the leg   Melanoma on the leg was removed in 2000. She continues regular dermatology follow-ups for skin checks. Continue regular dermatology follow-ups.  General Health Maintenance   The importance of staying active and maintaining a healthy lifestyle was discussed. She is encouraged to continue her current exercise  regimen, including yard work, biking, and hiking. Regular follow-ups are important to maintain continuity of care. A tetanus booster is due in January, and she is advised to consider getting it before surgery due to potential exposure to cuts while working in the yard. Continue the current exercise regimen, schedule annual follow-up visits, and consider getting a tetanus booster before surgery.     Return in about 1 year (around 10/14/2025) for chronic follow-up.  Jenkins CHRISTELLA Carrel, MD

## 2024-10-25 ENCOUNTER — Encounter (HOSPITAL_BASED_OUTPATIENT_CLINIC_OR_DEPARTMENT_OTHER): Payer: Self-pay

## 2024-10-25 DIAGNOSIS — R931 Abnormal findings on diagnostic imaging of heart and coronary circulation: Secondary | ICD-10-CM

## 2024-10-25 DIAGNOSIS — E7800 Pure hypercholesterolemia, unspecified: Secondary | ICD-10-CM

## 2024-10-27 ENCOUNTER — Other Ambulatory Visit (HOSPITAL_BASED_OUTPATIENT_CLINIC_OR_DEPARTMENT_OTHER): Payer: Self-pay

## 2024-10-27 MED ORDER — ROSUVASTATIN CALCIUM 5 MG PO TABS: 5.0000 mg | ORAL_TABLET | Freq: Every day | ORAL | 2 refills | Status: AC

## 2024-10-27 MED ORDER — TRAMADOL HCL 50 MG PO TABS
50.0000 mg | ORAL_TABLET | Freq: Four times a day (QID) | ORAL | 0 refills | Status: AC | PRN
  Filled 2024-10-27: qty 40, 5d supply, fill #0

## 2024-10-27 MED ORDER — HYDROMORPHONE HCL 2 MG PO TABS
2.0000 mg | ORAL_TABLET | ORAL | 0 refills | Status: AC | PRN
  Filled 2024-10-27: qty 42, 7d supply, fill #0

## 2024-10-31 ENCOUNTER — Other Ambulatory Visit (HOSPITAL_BASED_OUTPATIENT_CLINIC_OR_DEPARTMENT_OTHER): Payer: Self-pay

## 2024-12-09 ENCOUNTER — Encounter (HOSPITAL_BASED_OUTPATIENT_CLINIC_OR_DEPARTMENT_OTHER): Payer: Self-pay | Admitting: Cardiology

## 2024-12-11 ENCOUNTER — Ambulatory Visit: Admitting: Family Medicine

## 2024-12-16 ENCOUNTER — Encounter (HOSPITAL_BASED_OUTPATIENT_CLINIC_OR_DEPARTMENT_OTHER): Payer: Self-pay

## 2025-10-20 ENCOUNTER — Ambulatory Visit: Admitting: Family Medicine

## 5508-07-04 DEATH — deceased
